# Patient Record
Sex: Female | Born: 1960 | Hispanic: No | Smoking: Former smoker
Health system: Southern US, Community
[De-identification: ages and names within clinical notes are randomized; demographics above are authoritative.]

## PROBLEM LIST (undated history)

## (undated) DIAGNOSIS — N189 Chronic kidney disease, unspecified: Secondary | ICD-10-CM

## (undated) DIAGNOSIS — F329 Major depressive disorder, single episode, unspecified: Secondary | ICD-10-CM

## (undated) DIAGNOSIS — I10 Essential (primary) hypertension: Secondary | ICD-10-CM

## (undated) DIAGNOSIS — M1990 Unspecified osteoarthritis, unspecified site: Secondary | ICD-10-CM

## (undated) DIAGNOSIS — F419 Anxiety disorder, unspecified: Secondary | ICD-10-CM

## (undated) DIAGNOSIS — F32A Depression, unspecified: Secondary | ICD-10-CM

## (undated) DIAGNOSIS — E7800 Pure hypercholesterolemia, unspecified: Secondary | ICD-10-CM

## (undated) DIAGNOSIS — K219 Gastro-esophageal reflux disease without esophagitis: Secondary | ICD-10-CM

## (undated) HISTORY — PX: TONSILLECTOMY: SUR1361

## (undated) HISTORY — PX: OTHER SURGICAL HISTORY: SHX169

## (undated) HISTORY — PX: ABDOMINAL HYSTERECTOMY: SHX81

## (undated) HISTORY — PX: KNEE SURGERY: SHX244

## (undated) HISTORY — PX: BACK SURGERY: SHX140

## (undated) HISTORY — PX: NECK SURGERY: SHX720

## (undated) HISTORY — PX: EAR MASTOIDECTOMY W/ COCHLEAR IMPLANT W/ LANDMARK: SHX1483

---
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
## 1998-05-17 ENCOUNTER — Emergency Department (HOSPITAL_COMMUNITY): Admission: EM | Admit: 1998-05-17 | Discharge: 1998-05-17 | Payer: Self-pay | Admitting: Emergency Medicine

## 1998-08-05 ENCOUNTER — Emergency Department (HOSPITAL_COMMUNITY): Admission: EM | Admit: 1998-08-05 | Discharge: 1998-08-05 | Payer: Self-pay | Admitting: Emergency Medicine

## 1998-08-25 ENCOUNTER — Other Ambulatory Visit: Admission: RE | Admit: 1998-08-25 | Discharge: 1998-08-25 | Payer: Self-pay | Admitting: Obstetrics and Gynecology

## 1999-10-10 ENCOUNTER — Other Ambulatory Visit: Admission: RE | Admit: 1999-10-10 | Discharge: 1999-10-10 | Payer: Self-pay | Admitting: Obstetrics and Gynecology

## 2000-06-24 ENCOUNTER — Emergency Department (HOSPITAL_COMMUNITY): Admission: EM | Admit: 2000-06-24 | Discharge: 2000-06-24 | Payer: Self-pay | Admitting: Emergency Medicine

## 2000-06-24 ENCOUNTER — Encounter: Payer: Self-pay | Admitting: Emergency Medicine

## 2001-02-20 ENCOUNTER — Other Ambulatory Visit: Admission: RE | Admit: 2001-02-20 | Discharge: 2001-02-20 | Payer: Self-pay | Admitting: Obstetrics and Gynecology

## 2001-05-08 ENCOUNTER — Encounter: Payer: Self-pay | Admitting: Obstetrics and Gynecology

## 2001-05-08 ENCOUNTER — Ambulatory Visit (HOSPITAL_COMMUNITY): Admission: RE | Admit: 2001-05-08 | Discharge: 2001-05-08 | Payer: Self-pay | Admitting: Obstetrics and Gynecology

## 2001-12-29 ENCOUNTER — Ambulatory Visit: Admission: RE | Admit: 2001-12-29 | Discharge: 2001-12-29 | Payer: Self-pay | Admitting: Orthopedic Surgery

## 2002-01-08 ENCOUNTER — Encounter: Admission: RE | Admit: 2002-01-08 | Discharge: 2002-03-26 | Payer: Self-pay | Admitting: Orthopedic Surgery

## 2002-01-21 ENCOUNTER — Ambulatory Visit (HOSPITAL_COMMUNITY): Admission: RE | Admit: 2002-01-21 | Discharge: 2002-01-21 | Payer: Self-pay | Admitting: Orthopedic Surgery

## 2002-01-24 ENCOUNTER — Emergency Department (HOSPITAL_COMMUNITY): Admission: EM | Admit: 2002-01-24 | Discharge: 2002-01-24 | Payer: Self-pay | Admitting: Emergency Medicine

## 2002-02-19 ENCOUNTER — Other Ambulatory Visit: Admission: RE | Admit: 2002-02-19 | Discharge: 2002-02-19 | Payer: Self-pay | Admitting: Obstetrics and Gynecology

## 2002-06-18 ENCOUNTER — Encounter: Admission: RE | Admit: 2002-06-18 | Discharge: 2002-09-16 | Payer: Self-pay | Admitting: Anesthesiology

## 2002-11-16 ENCOUNTER — Ambulatory Visit (HOSPITAL_COMMUNITY): Admission: RE | Admit: 2002-11-16 | Discharge: 2002-11-16 | Payer: Self-pay | Admitting: Orthopedic Surgery

## 2002-11-16 ENCOUNTER — Encounter: Payer: Self-pay | Admitting: Orthopedic Surgery

## 2020-04-28 ENCOUNTER — Encounter (HOSPITAL_COMMUNITY): Payer: Self-pay

## 2020-04-28 ENCOUNTER — Emergency Department (HOSPITAL_COMMUNITY): Payer: Medicare HMO

## 2020-04-28 ENCOUNTER — Emergency Department (HOSPITAL_COMMUNITY)
Admission: EM | Admit: 2020-04-28 | Discharge: 2020-04-28 | Disposition: A | Discharge status: Home / Self Care | Payer: Medicare HMO | Attending: Emergency Medicine | Admitting: Emergency Medicine

## 2020-04-28 ENCOUNTER — Other Ambulatory Visit: Payer: Self-pay

## 2020-04-28 DIAGNOSIS — M1711 Unilateral primary osteoarthritis, right knee: Secondary | ICD-10-CM | POA: Diagnosis not present

## 2020-04-28 DIAGNOSIS — Z87891 Personal history of nicotine dependence: Secondary | ICD-10-CM | POA: Insufficient documentation

## 2020-04-28 DIAGNOSIS — M25561 Pain in right knee: Secondary | ICD-10-CM | POA: Diagnosis present

## 2020-04-28 HISTORY — DX: Depression, unspecified: F32.A

## 2020-04-28 HISTORY — DX: Anxiety disorder, unspecified: F41.9

## 2020-04-28 IMAGING — CR DG KNEE COMPLETE 4+V*R*
4 series · 4 of 4 positions shown · non-contrast
Comparison: None.

CLINICAL DATA: Pain

EXAM:
RIGHT KNEE - COMPLETE 4+ VIEW

[t knee obl right (1 of 2)]
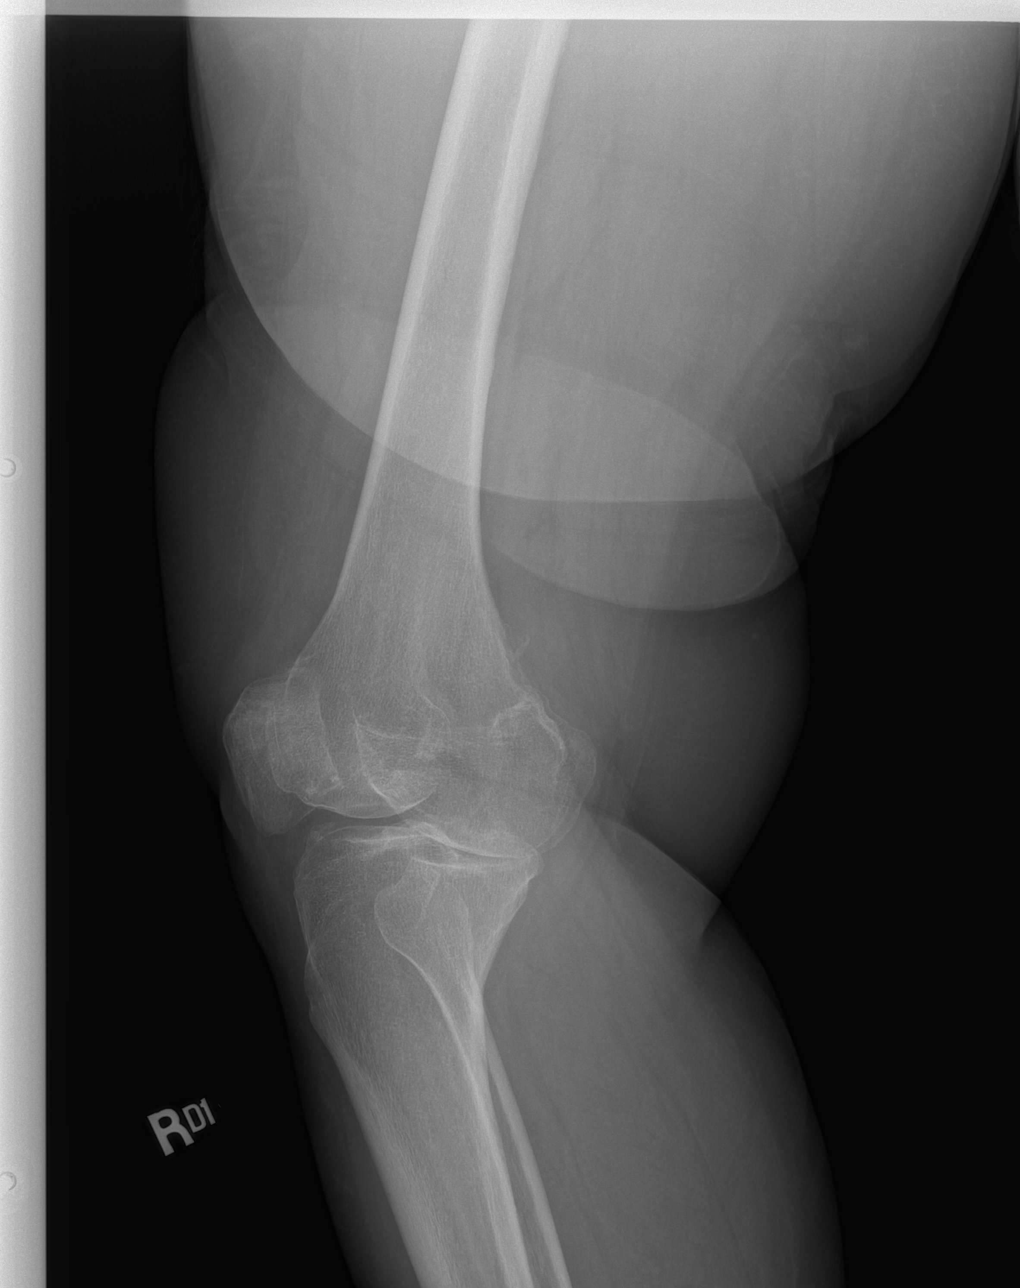

[t knee ap right]
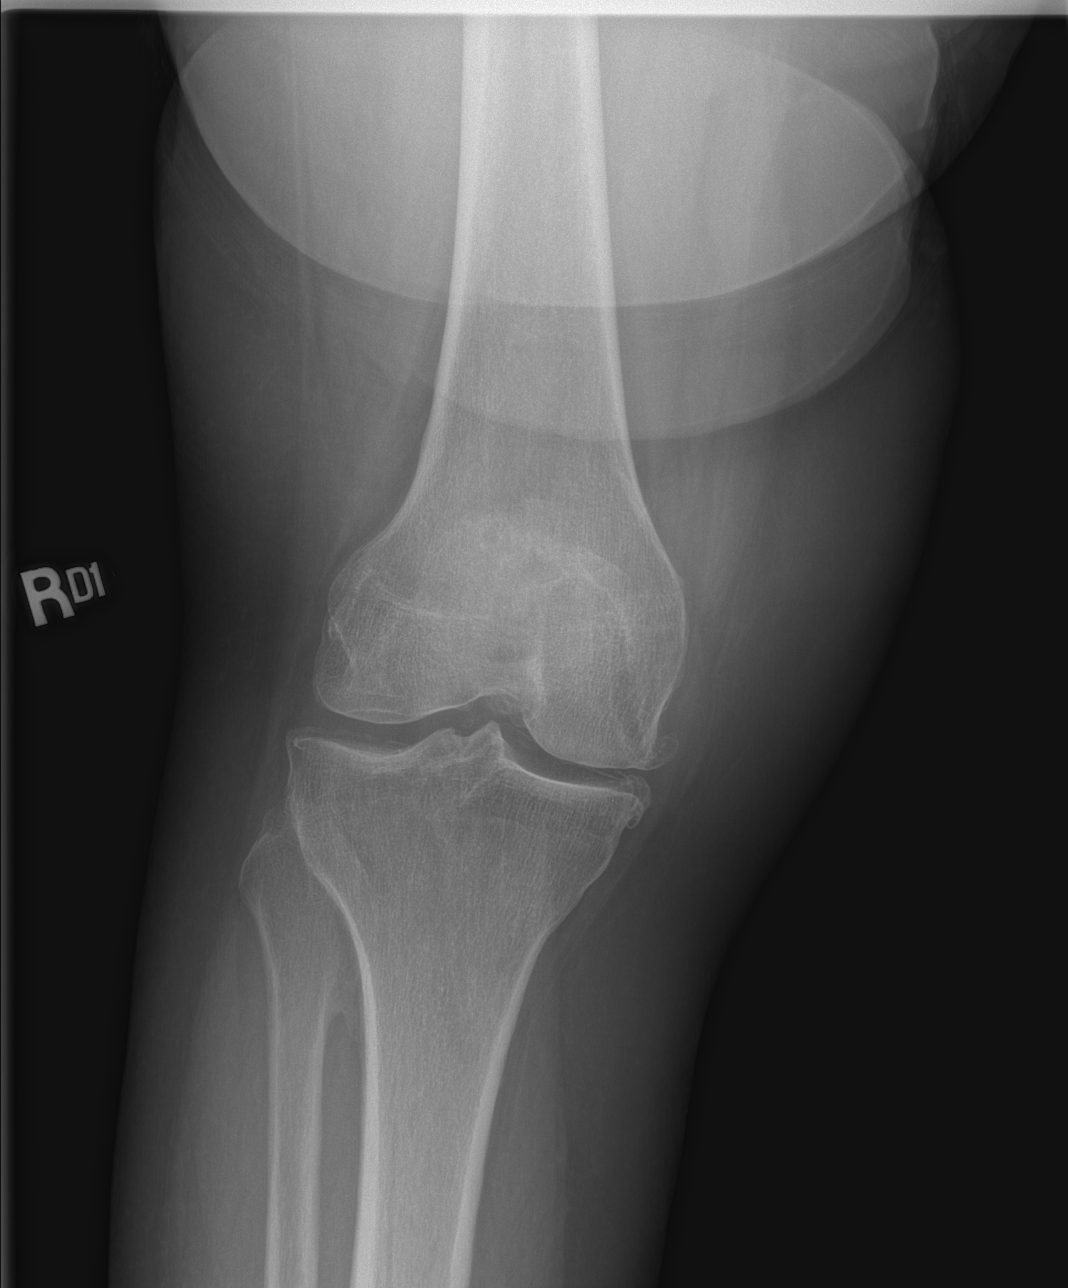

[t knee obl right (2 of 2)]
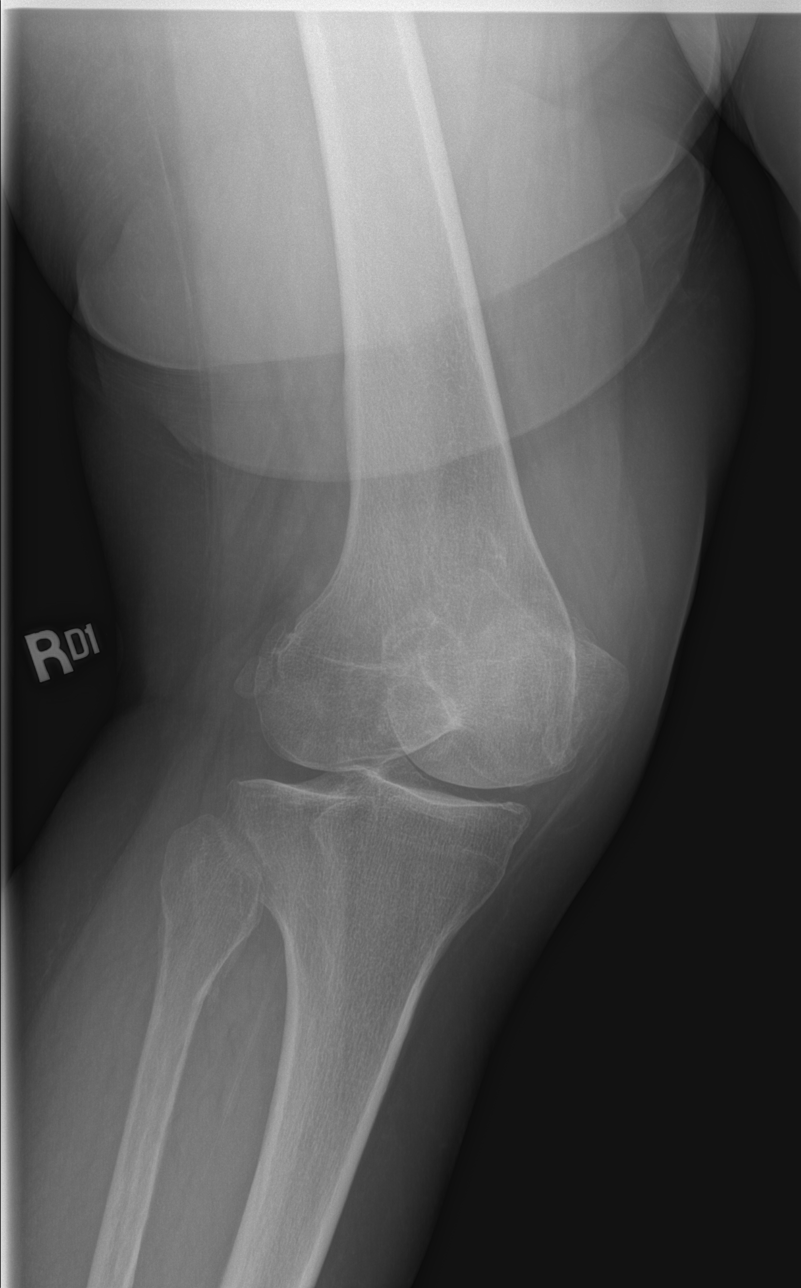

[t knee lat right]
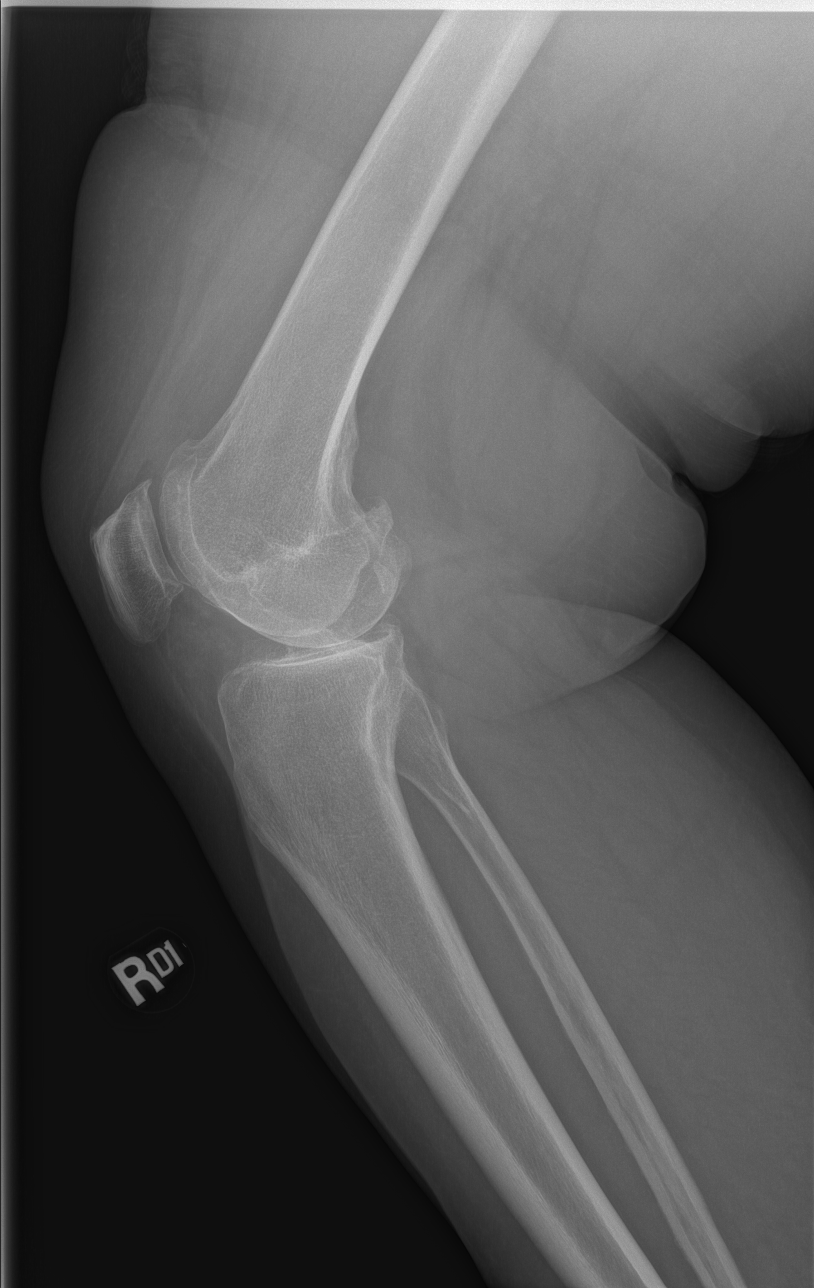

[4 of 4 positions shown; findings below may reference images not displayed]

FINDINGS: Frontal, lateral, and bilateral oblique views were obtained. There
is no fracture, dislocation, or joint effusion. There is moderately
severe narrowing of the patellofemoral joint. There is milder
narrowing medially. There is spurring medially and arising from the
posterior patella. No erosive change.
IMPRESSION: Osteoarthritic change, most notable in the patellofemoral joint. No
evident fracture, dislocation, or joint effusion.

## 2020-04-28 NOTE — ED Notes (Addendum)
Ortho called at this time. Per ortho: Brace will have to be ordered. Ortho will order brace.

## 2020-04-28 NOTE — Progress Notes (Signed)
Orthopedic Tech Progress Note Patient Details:  Brandy Delacruz 1961/10/16 [id number]  Patient ID: Brandy Delacruz, female   DOB: 05-15-1961, 59 y.o.   MRN: [id number]   Brandy Delacruz 04/28/2020, 12:28 PMCalled [facility] for hinged knee brace.

## 2020-04-28 NOTE — ED Triage Notes (Addendum)
Pt arrives [facility] from home with complaints of right knee pain that has been ongoing. Pt states she is supposed to have a knee replacement but has been unable to as of this time. Pt reports the knee "dislocates and pops back in" Pt reports that she took a tramadol, klonopin, and a muscle relaxer this am. Pt is drowsy in triage

## 2020-04-28 NOTE — Discharge Instructions (Addendum)
Please follow-up with your orthopedist, Dr. Madelon Lips, as soon as possible for ongoing evaluation management.  You likely benefit from steroid injection.  You also need to plan for definitive intervention via total knee replacement.  Please continue take your pain medications, as prescribed.  Please return to the ED or seek immediate medical attention should you experience any new or worsening symptoms.

## 2020-04-28 NOTE — ED Provider Notes (Signed)
[facility] Note   CSN: [id number] Arrival date & time: 04/28/20  1025     History Chief Complaint  Patient presents with  . Knee Pain    Brandy Delacruz is a 59 y.o. female with PMH of anxiety and depression who presents the ED via EMS for ongoing right knee discomfort.  On exam, patient is drowsy and endorses tramadol, Klonopin, and muscle relaxant use this morning PTA.  Patient states that she is followed by Dr. Madelon Lips with [facility].  Her knee discomfort stems from a meniscal injury in 2003 that resulted in numerous orthopedic surgeries.  She states that she was informed back in December 2020 that she would require a total knee replacement, however is also in the process of obtaining bladder surgery for her stress incontinence.  She recently was able to lose 40 pounds, per request of her orthopedist, in order to be a more viable surgical candidate.  She reports that she has still been able to ambulate despite her significant osteoarthritis, however this morning her knee was "locked" and she was having difficulty with any flexion and extension.  She denies any precipitating injury, redness or swelling, weakness or numbness, inability to wiggle toes, inability to flex or extend the joint, or other symptoms.  HPI     Past Medical History:  Diagnosis Date  . Anxiety   . Depression     There are no problems to display for this patient.   Past Surgical History:  Procedure Laterality Date  . BACK SURGERY       OB History   No obstetric history on file.     History reviewed. No pertinent family history.  Social History   Tobacco Use  . Smoking status: Former Games developer  . Smokeless tobacco: Never Used  Substance Use Topics  . Alcohol use: Yes    Comment: occ  . Drug use: Never    Home Medications Prior to Admission medications   Not on File    Allergies    Patient has no known  allergies.  Review of Systems   Review of Systems  Constitutional: Negative for fever.  Musculoskeletal: Positive for arthralgias and gait problem. Negative for joint swelling.  Skin: Negative for color change and wound.  Neurological: Negative for weakness and numbness.    Physical Exam Updated Vital Signs BP 95/69   Pulse (!) 56   Temp 98 F (36.7 C) (Oral)   Resp 16   SpO2 99%   Physical Exam Vitals and nursing note reviewed. Exam conducted with a chaperone present.  Constitutional:      Appearance: Normal appearance.  HENT:     Head: Normocephalic and atraumatic.  Eyes:     General: No scleral icterus.    Conjunctiva/sclera: Conjunctivae normal.  Cardiovascular:     Rate and Rhythm: Normal rate and regular rhythm.     Pulses: Normal pulses.     Heart sounds: Normal heart sounds.  Pulmonary:     Effort: Pulmonary effort is normal. No respiratory distress.     Breath sounds: Normal breath sounds. No wheezing or rales.  Musculoskeletal:     Cervical back: Normal range of motion. No rigidity.     Comments: Right knee: No significant swelling.  No erythema, ecchymosis, or other overlying skin changes.  ROM limited due to swelling and discomfort, however can still actively flex and extend.  Pedal pulse and cap refill intact.  Soft compartments.  Sensation intact  throughout.  Skin:    General: Skin is dry.     Capillary Refill: Capillary refill takes less than 2 seconds.  Neurological:     Mental Status: She is alert and oriented to person, place, and time.     GCS: GCS eye subscore is 4. GCS verbal subscore is 5. GCS motor subscore is 6.  Psychiatric:        Mood and Affect: Mood normal.        Behavior: Behavior normal.        Thought Content: Thought content normal.      ED Results / Procedures / Treatments   Labs (all labs ordered are listed, but only abnormal results are displayed) Labs Reviewed - No data to display  EKG None  Radiology DG Knee Complete  4 Views Right  Result Date: 04/28/2020 CLINICAL DATA:  Pain EXAM: RIGHT KNEE - COMPLETE 4+ VIEW COMPARISON:  None. FINDINGS: Frontal, lateral, and bilateral oblique views were obtained. There is no fracture, dislocation, or joint effusion. There is moderately severe narrowing of the patellofemoral joint. There is milder narrowing medially. There is spurring medially and arising from the posterior patella. No erosive change. IMPRESSION: Osteoarthritic change, most notable in the patellofemoral joint. No evident fracture, dislocation, or joint effusion. Electronically Signed   By: Bretta Bang III M.D.   On: 04/28/2020 11:29    Procedures Procedures (including critical care time)  Medications Ordered in ED Medications - No data to display  ED Course  I have reviewed the triage vital signs and the nursing notes.  Pertinent labs & imaging results that were available during my care of the patient were reviewed by me and considered in my medical decision making (see chart for details).    MDM Rules/Calculators/A&P                      Patient has well-documented osteoarthritis, particularly involving right knee.  She is followed by Dr. Madelon Lips [facility] and had discussed total knee replacement nearly 6 months ago.  She states that her symptoms have been getting progressively worse, however today was the first time that she needed help to get out of bed due to her significant leg stiffness and "grinding" pain.  She also endorses intermittent joint laxity.  Given this report, will provide hinged knee brace.  However, emphasized the importance of following up with her orthopedist for ongoing evaluation and management.  She had already taken Lyrica, Klonopin, tramadol, and a muscle relaxant PTA.  Discussed why further medication at this point could put her at increased risk of respiratory depression.  Instead, recommending that she go to her orthopedist the soonest possible she  would likely benefit from steroid injection.  She could also discuss plans for surgical intervention for definitive management.  She has done well to lose weight, per recommendation of her orthopedist, to prepare her for surgery.    Patient voices understanding is agreeable to plan.  Strict ED return precautions discussed.  All of the evaluation and work-up results were discussed with the patient and any family at bedside. They were provided opportunity to ask any additional questions and have none at this time. They have expressed understanding of verbal discharge instructions as well as return precautions and are agreeable to the plan.    Final Clinical Impression(s) / ED Diagnoses Final diagnoses:  Primary osteoarthritis of right knee    Rx / DC Orders ED Discharge Orders  None       Reita Chard 04/28/20 1204    Daleen Bo, MD 04/28/20 [id number]

## 2020-11-14 ENCOUNTER — Other Ambulatory Visit: Payer: Self-pay

## 2020-11-14 ENCOUNTER — Emergency Department (HOSPITAL_COMMUNITY)
Admission: EM | Admit: 2020-11-14 | Discharge: 2020-11-14 | Disposition: A | Discharge status: Home / Self Care | Payer: Medicare HMO | Attending: Emergency Medicine | Admitting: Emergency Medicine

## 2020-11-14 ENCOUNTER — Emergency Department (HOSPITAL_COMMUNITY): Payer: Medicare HMO

## 2020-11-14 ENCOUNTER — Encounter (HOSPITAL_COMMUNITY): Payer: Self-pay

## 2020-11-14 DIAGNOSIS — Z87891 Personal history of nicotine dependence: Secondary | ICD-10-CM | POA: Diagnosis not present

## 2020-11-14 DIAGNOSIS — K2900 Acute gastritis without bleeding: Secondary | ICD-10-CM | POA: Diagnosis not present

## 2020-11-14 DIAGNOSIS — R109 Unspecified abdominal pain: Secondary | ICD-10-CM | POA: Diagnosis present

## 2020-11-14 DIAGNOSIS — K219 Gastro-esophageal reflux disease without esophagitis: Secondary | ICD-10-CM | POA: Insufficient documentation

## 2020-11-14 HISTORY — DX: Gastro-esophageal reflux disease without esophagitis: K21.9

## 2020-11-14 LAB — TROPONIN I (HIGH SENSITIVITY)
Troponin I (High Sensitivity): 10 ng/L (ref <18)
Troponin I (High Sensitivity): 8 ng/L (ref <18)

## 2020-11-14 LAB — COMPREHENSIVE METABOLIC PANEL
ALT: 27 U/L (ref 0–44)
AST: 31 U/L (ref 15–41)
Albumin: 4.5 g/dL (ref 3.5–5.0)
Alkaline Phosphatase: 63 U/L (ref 38–126)
Anion gap: 8 (ref 5–15)
BUN: 24 mg/dL — ABNORMAL HIGH (ref 6–20)
CO2: 29 mmol/L (ref 22–32)
Calcium: 9.5 mg/dL (ref 8.9–10.3)
Chloride: 105 mmol/L (ref 98–111)
Creatinine, Ser: 1.11 mg/dL — ABNORMAL HIGH (ref 0.44–1.00)
GFR, Estimated: 58 mL/min — ABNORMAL LOW (ref >60)
Glucose, Bld: 107 mg/dL — ABNORMAL HIGH (ref 70–99)
Potassium: 4.3 mmol/L (ref 3.5–5.1)
Sodium: 142 mmol/L (ref 135–145)
Total Bilirubin: 0.6 mg/dL (ref 0.3–1.2)
Total Protein: 7.7 g/dL (ref 6.5–8.1)

## 2020-11-14 LAB — URINALYSIS, ROUTINE W REFLEX MICROSCOPIC
Bilirubin Urine: NEGATIVE
Glucose, UA: NEGATIVE mg/dL
Hgb urine dipstick: NEGATIVE
Ketones, ur: NEGATIVE mg/dL
Leukocytes,Ua: NEGATIVE
Nitrite: NEGATIVE
Protein, ur: NEGATIVE mg/dL
Specific Gravity, Urine: 1.004 — ABNORMAL LOW (ref 1.005–1.030)
pH: 8 (ref 5.0–8.0)

## 2020-11-14 LAB — CBC
HCT: 39.4 % (ref 36.0–46.0)
Hemoglobin: 12.7 g/dL (ref 12.0–15.0)
MCH: 31.1 pg (ref 26.0–34.0)
MCHC: 32.2 g/dL (ref 30.0–36.0)
MCV: 96.6 fL (ref 80.0–100.0)
Platelets: 198 10*3/uL (ref 150–400)
RBC: 4.08 MIL/uL (ref 3.87–5.11)
RDW: 13.3 % (ref 11.5–15.5)
WBC: 5.5 10*3/uL (ref 4.0–10.5)
nRBC: 0 % (ref 0.0–0.2)

## 2020-11-14 LAB — LIPASE, BLOOD: Lipase: 42 U/L (ref 11–51)

## 2020-11-14 IMAGING — DX DG CHEST 1V PORT
1 series · 1 of 1 positions shown · non-contrast
Comparison: None.

CLINICAL DATA: Epigastric pain

EXAM:
PORTABLE CHEST 1 VIEW

[chest ap]
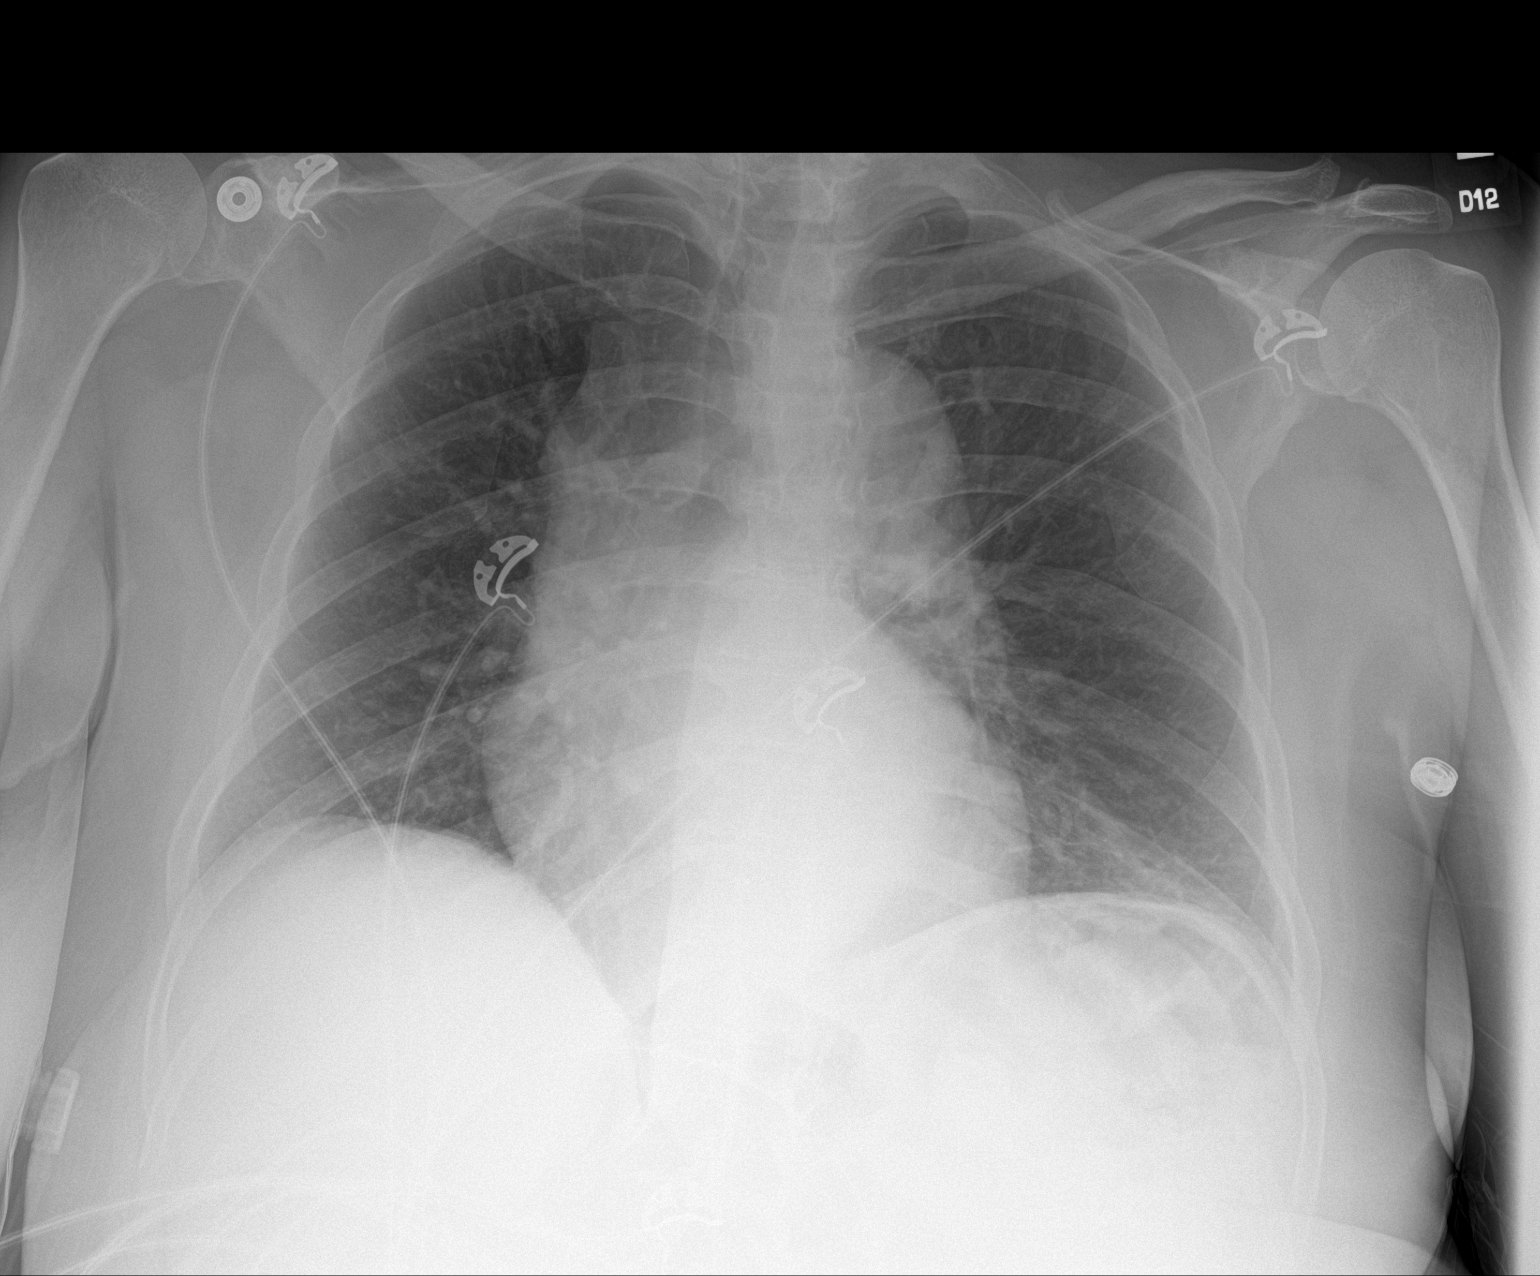

[1 of 1 positions shown; findings below may reference images not displayed]

FINDINGS: Surgical hardware in the cervical spine. Patchy atelectasis left
base. Normal heart size. Borderline enlarged mediastinal silhouette
likely due to rotation and portable technique. No pneumothorax
IMPRESSION: Patchy opacity left base, favor atelectasis.

## 2020-11-14 MED ORDER — SODIUM CHLORIDE 0.9 % IV BOLUS
1000.0000 mL | Freq: Once | INTRAVENOUS | Status: AC
  Administered 2020-11-14: 1000 mL via INTRAVENOUS

## 2020-11-14 MED ORDER — HALOPERIDOL LACTATE 5 MG/ML IJ SOLN
2.0000 mg | Freq: Once | INTRAMUSCULAR | Status: AC
  Administered 2020-11-14: 2 mg via INTRAVENOUS
  Filled 2020-11-14: qty 1

## 2020-11-14 MED ORDER — FAMOTIDINE IN NACL 20-0.9 MG/50ML-% IV SOLN
20.0000 mg | Freq: Once | INTRAVENOUS | Status: AC
  Administered 2020-11-14: 20 mg via INTRAVENOUS
  Filled 2020-11-14: qty 50

## 2020-11-14 MED ORDER — ONDANSETRON HCL 4 MG/2ML IJ SOLN
4.0000 mg | Freq: Once | INTRAMUSCULAR | Status: AC
  Administered 2020-11-14: 4 mg via INTRAVENOUS
  Filled 2020-11-14: qty 2

## 2020-11-14 MED ORDER — SUCRALFATE 1 G PO TABS
1.0000 g | ORAL_TABLET | Freq: Four times a day (QID) | ORAL | 0 refills | Status: DC | PRN
Start: 2020-11-14 — End: 2021-09-01

## 2020-11-14 NOTE — ED Provider Notes (Signed)
[facility]-EMERGENCY DEPT [facility] Emergency Department Provider Note MRN:  [id number]  Arrival date & time: 11/14/20     Chief Complaint   Abdominal Pain and Emesis   History of Present Illness   Brandy Delacruz is a 59 y.o. year-old female with a history of GERD presenting to the ED with chief complaint of abdominal pain and emesis.  Patient work-up with some very mild discomfort in the abdomen described as a sensation of feeling hungry.  When she proceeded to try to have a light breakfast she began experiencing severe nausea and profuse vomiting.  She is endorsing burning epigastric pain with radiation up into the chest.  Pain is currently 7 out of 10, constant, no exacerbating relieving factors.  Endorsing normal bowel movements recently, passing gas today.  Review of Systems  A complete 10 system review of systems was obtained and all systems are negative except as noted in the HPI and PMH.   Patient's Health History    Past Medical History:  Diagnosis Date  . Anxiety   . Depression   . GERD (gastroesophageal reflux disease)     Past Surgical History:  Procedure Laterality Date  . ABDOMINAL HYSTERECTOMY    . BACK SURGERY    . bladder stimulator    . KNEE SURGERY Right   . NECK SURGERY      Family History  Problem Relation Age of Onset  . Diabetes Mother   . Stroke Mother     Social History   Socioeconomic History  . Marital status: Legally Separated    Spouse name: Not on file  . Number of children: Not on file  . Years of education: Not on file  . Highest education level: Not on file  Occupational History  . Not on file  Tobacco Use  . Smoking status: Former Games developer  . Smokeless tobacco: Never Used  Vaping Use  . Vaping Use: Never used  Substance and Sexual Activity  . Alcohol use: Yes    Comment: occ  . Drug use: Never  . Sexual activity: Not on file  Other Topics Concern  . Not on file  Social History Narrative  . Not on file   Social  Determinants of Health   Financial Resource Strain:   . Difficulty of Paying Living Expenses: Not on file  Food Insecurity:   . Worried About Programme researcher, broadcasting/film/video in the Last Year: Not on file  . Ran Out of Food in the Last Year: Not on file  Transportation Needs:   . Lack of Transportation (Medical): Not on file  . Lack of Transportation (Non-Medical): Not on file  Physical Activity:   . Days of Exercise per Week: Not on file  . Minutes of Exercise per Session: Not on file  Stress:   . Feeling of Stress : Not on file  Social Connections:   . Frequency of Communication with Friends and Family: Not on file  . Frequency of Social Gatherings with Friends and Family: Not on file  . Attends Religious Services: Not on file  . Active Member of Clubs or Organizations: Not on file  . Attends Banker Meetings: Not on file  . Marital Status: Not on file  Intimate Partner Violence:   . Fear of Current or Ex-Partner: Not on file  . Emotionally Abused: Not on file  . Physically Abused: Not on file  . Sexually Abused: Not on file     Physical Exam   Vitals:  11/14/20 1830 11/14/20 1930  BP: (!) 142/85 129/85  Pulse: (!) 56 64  Resp: 17 13  Temp:    SpO2: 98% 97%    CONSTITUTIONAL: Well-appearing, NAD NEURO:  Alert and oriented x 3, no focal deficits EYES:  eyes equal and reactive ENT/NECK:  no LAD, no JVD CARDIO: Regular rate, well-perfused, normal S1 and S2 PULM:  CTAB no wheezing or rhonchi GI/GU:  normal bowel sounds, non-distended, non-tender MSK/SPINE:  No gross deformities, no edema SKIN:  no rash, atraumatic PSYCH:  Appropriate speech and behavior  *Additional and/or pertinent findings included in MDM below  Diagnostic and Interventional Summary    EKG Interpretation  Date/Time:  November 14, 2020 at 16: 38: 39 Ventricular Rate:  69 PR Interval:    QRS Duration: 99   QT Interval:  461 QTC Calculation: 494 R Axis:     Text Interpretation: Sinus  rhythm, normal intervals Confirm by Dr. Kennis Carina at 4:49 PM      Labs Reviewed  COMPREHENSIVE METABOLIC PANEL - Abnormal; Notable for the following components:      Result Value   Glucose, Bld 107 (*)    BUN 24 (*)    Creatinine, Ser 1.11 (*)    GFR, Estimated 58 (*)    All other components within normal limits  URINALYSIS, ROUTINE W REFLEX MICROSCOPIC - Abnormal; Notable for the following components:   Color, Urine COLORLESS (*)    Specific Gravity, Urine 1.004 (*)    All other components within normal limits  LIPASE, BLOOD  CBC  TROPONIN I (HIGH SENSITIVITY)  TROPONIN I (HIGH SENSITIVITY)    DG Chest Port 1 View  Final Result      Medications  sodium chloride 0.9 % bolus 1,000 mL (0 mLs Intravenous Stopped 11/14/20 1730)  ondansetron (ZOFRAN) injection 4 mg (4 mg Intravenous Given 11/14/20 1648)  famotidine (PEPCID) IVPB 20 mg premix (0 mg Intravenous Stopped 11/14/20 1730)  haloperidol lactate (HALDOL) injection 2 mg (2 mg Intravenous Given 11/14/20 1924)     Procedures  /  Critical Care Procedures  ED Course and Medical Decision Making  I have reviewed the triage vital signs, the nursing notes, and pertinent available records from the EMR.  Listed above are laboratory and imaging tests that I personally ordered, reviewed, and interpreted and then considered in my medical decision making (see below for details).  Burning epigastric pain with radiation into the chest, favoring GERD or gastritis.  Recent implantation of bladder stimulator 11 days ago, she denies having any issues with this procedure.  Denies fever.  Abdomen soft and nontender, providing pain control, fluids, Zofran, awaiting labs, chest x-ray.     Patient feeling much better after interventions listed above.  Work-up is very reassuring, troponin negative x2, normal labs.  Patient likely has gastritis.  She has not been taking her omeprazole daily.  Appropriate for discharge.  Elmer Sow. Pilar Plate,  MD [facility] Emergency Medicine [facility] [email or web address]  Final Clinical Impressions(s) / ED Diagnoses     ICD-10-CM   1. Acute gastritis without hemorrhage, unspecified gastritis type  K29.00     ED Discharge Orders         Ordered    sucralfate (CARAFATE) 1 g tablet  4 times daily PRN        11/14/20 2030           Discharge Instructions Discussed with and Provided to Patient:     Discharge Instructions  You were evaluated in the Emergency Department and after careful evaluation, we did not find any emergent condition requiring admission or further testing in the hospital.  Your exam/testing today was overall reassuring.  Symptoms seem to be due to gastritis.  Please continue taking your omeprazole at home.  You can use the Carafate medication during the day for discomfort as needed.  Please return to the Emergency Department if you experience any worsening of your condition.  Thank you for allowing [place] to be a part of your care.        Sabas Sous, MD 11/14/20 2031

## 2020-11-14 NOTE — ED Triage Notes (Signed)
Per EMS- patient c/o epigastric pain, N/V since waking at 0900 today. Patient took Zofran and Pepto Bismol but vomited.  EMS gave Zofran 4 mg Iv prior to arrival to the ED.

## 2020-11-14 NOTE — Discharge Instructions (Addendum)
You were evaluated in the Emergency Department and after careful evaluation, we did not find any emergent condition requiring admission or further testing in the hospital.  Your exam/testing today was overall reassuring.  Symptoms seem to be due to gastritis.  Please continue taking your omeprazole at home.  You can use the Carafate medication during the day for discomfort as needed.  Please return to the Emergency Department if you experience any worsening of your condition.  Thank you for allowing [place] to be a part of your care.

## 2020-11-14 NOTE — ED Notes (Signed)
Assumed care for pt at this time. Brought to room from waiting room.

## 2021-02-10 DIAGNOSIS — M47816 Spondylosis without myelopathy or radiculopathy, lumbar region: Secondary | ICD-10-CM | POA: Diagnosis not present

## 2021-02-10 DIAGNOSIS — M4712 Other spondylosis with myelopathy, cervical region: Secondary | ICD-10-CM | POA: Diagnosis not present

## 2021-02-10 DIAGNOSIS — G894 Chronic pain syndrome: Secondary | ICD-10-CM | POA: Diagnosis not present

## 2021-02-10 DIAGNOSIS — M150 Primary generalized (osteo)arthritis: Secondary | ICD-10-CM | POA: Diagnosis not present

## 2021-03-09 DIAGNOSIS — E7800 Pure hypercholesterolemia, unspecified: Secondary | ICD-10-CM | POA: Diagnosis not present

## 2021-03-09 DIAGNOSIS — K219 Gastro-esophageal reflux disease without esophagitis: Secondary | ICD-10-CM | POA: Diagnosis not present

## 2021-03-09 DIAGNOSIS — R69 Illness, unspecified: Secondary | ICD-10-CM | POA: Diagnosis not present

## 2021-03-09 DIAGNOSIS — F419 Anxiety disorder, unspecified: Secondary | ICD-10-CM | POA: Diagnosis not present

## 2021-03-09 DIAGNOSIS — N1831 Chronic kidney disease, stage 3a: Secondary | ICD-10-CM | POA: Diagnosis not present

## 2021-03-09 DIAGNOSIS — G894 Chronic pain syndrome: Secondary | ICD-10-CM | POA: Diagnosis not present

## 2021-03-09 DIAGNOSIS — F5101 Primary insomnia: Secondary | ICD-10-CM | POA: Diagnosis not present

## 2021-03-09 DIAGNOSIS — E559 Vitamin D deficiency, unspecified: Secondary | ICD-10-CM | POA: Diagnosis not present

## 2021-04-26 DIAGNOSIS — M4712 Other spondylosis with myelopathy, cervical region: Secondary | ICD-10-CM | POA: Diagnosis not present

## 2021-04-26 DIAGNOSIS — G894 Chronic pain syndrome: Secondary | ICD-10-CM | POA: Diagnosis not present

## 2021-04-26 DIAGNOSIS — M150 Primary generalized (osteo)arthritis: Secondary | ICD-10-CM | POA: Diagnosis not present

## 2021-04-26 DIAGNOSIS — M47816 Spondylosis without myelopathy or radiculopathy, lumbar region: Secondary | ICD-10-CM | POA: Diagnosis not present

## 2021-04-26 DIAGNOSIS — Z79891 Long term (current) use of opiate analgesic: Secondary | ICD-10-CM | POA: Diagnosis not present

## 2021-05-17 DIAGNOSIS — M4712 Other spondylosis with myelopathy, cervical region: Secondary | ICD-10-CM | POA: Diagnosis not present

## 2021-05-17 DIAGNOSIS — M47816 Spondylosis without myelopathy or radiculopathy, lumbar region: Secondary | ICD-10-CM | POA: Diagnosis not present

## 2021-05-17 DIAGNOSIS — G894 Chronic pain syndrome: Secondary | ICD-10-CM | POA: Diagnosis not present

## 2021-05-17 DIAGNOSIS — M150 Primary generalized (osteo)arthritis: Secondary | ICD-10-CM | POA: Diagnosis not present

## 2021-05-19 DIAGNOSIS — K4090 Unilateral inguinal hernia, without obstruction or gangrene, not specified as recurrent: Secondary | ICD-10-CM | POA: Diagnosis not present

## 2021-05-31 DIAGNOSIS — M4712 Other spondylosis with myelopathy, cervical region: Secondary | ICD-10-CM | POA: Diagnosis not present

## 2021-05-31 DIAGNOSIS — G894 Chronic pain syndrome: Secondary | ICD-10-CM | POA: Diagnosis not present

## 2021-05-31 DIAGNOSIS — M150 Primary generalized (osteo)arthritis: Secondary | ICD-10-CM | POA: Diagnosis not present

## 2021-05-31 DIAGNOSIS — M47816 Spondylosis without myelopathy or radiculopathy, lumbar region: Secondary | ICD-10-CM | POA: Diagnosis not present

## 2021-06-06 DIAGNOSIS — K449 Diaphragmatic hernia without obstruction or gangrene: Secondary | ICD-10-CM | POA: Diagnosis not present

## 2021-06-06 DIAGNOSIS — R140 Abdominal distension (gaseous): Secondary | ICD-10-CM | POA: Diagnosis not present

## 2021-06-06 DIAGNOSIS — K2289 Other specified disease of esophagus: Secondary | ICD-10-CM | POA: Diagnosis not present

## 2021-06-06 DIAGNOSIS — R1011 Right upper quadrant pain: Secondary | ICD-10-CM | POA: Diagnosis not present

## 2021-06-06 DIAGNOSIS — M7989 Other specified soft tissue disorders: Secondary | ICD-10-CM | POA: Diagnosis not present

## 2021-06-06 DIAGNOSIS — K828 Other specified diseases of gallbladder: Secondary | ICD-10-CM | POA: Diagnosis not present

## 2021-06-06 DIAGNOSIS — R2241 Localized swelling, mass and lump, right lower limb: Secondary | ICD-10-CM | POA: Diagnosis not present

## 2021-06-06 DIAGNOSIS — Z79899 Other long term (current) drug therapy: Secondary | ICD-10-CM | POA: Diagnosis not present

## 2021-06-06 DIAGNOSIS — R635 Abnormal weight gain: Secondary | ICD-10-CM | POA: Diagnosis not present

## 2021-06-06 DIAGNOSIS — R7989 Other specified abnormal findings of blood chemistry: Secondary | ICD-10-CM | POA: Diagnosis not present

## 2021-06-06 DIAGNOSIS — K429 Umbilical hernia without obstruction or gangrene: Secondary | ICD-10-CM | POA: Diagnosis not present

## 2021-06-06 DIAGNOSIS — K219 Gastro-esophageal reflux disease without esophagitis: Secondary | ICD-10-CM | POA: Diagnosis not present

## 2021-06-06 DIAGNOSIS — M1909 Primary osteoarthritis, other specified site: Secondary | ICD-10-CM | POA: Diagnosis not present

## 2021-06-06 DIAGNOSIS — R600 Localized edema: Secondary | ICD-10-CM | POA: Diagnosis not present

## 2021-06-06 DIAGNOSIS — R1031 Right lower quadrant pain: Secondary | ICD-10-CM | POA: Diagnosis not present

## 2021-06-06 DIAGNOSIS — R0602 Shortness of breath: Secondary | ICD-10-CM | POA: Diagnosis not present

## 2021-06-06 DIAGNOSIS — E785 Hyperlipidemia, unspecified: Secondary | ICD-10-CM | POA: Diagnosis not present

## 2021-06-06 DIAGNOSIS — R69 Illness, unspecified: Secondary | ICD-10-CM | POA: Diagnosis not present

## 2021-06-07 DIAGNOSIS — R0602 Shortness of breath: Secondary | ICD-10-CM | POA: Diagnosis not present

## 2021-06-07 DIAGNOSIS — R140 Abdominal distension (gaseous): Secondary | ICD-10-CM | POA: Diagnosis not present

## 2021-06-07 DIAGNOSIS — K828 Other specified diseases of gallbladder: Secondary | ICD-10-CM | POA: Diagnosis not present

## 2021-06-07 DIAGNOSIS — K449 Diaphragmatic hernia without obstruction or gangrene: Secondary | ICD-10-CM | POA: Diagnosis not present

## 2021-06-07 DIAGNOSIS — K2289 Other specified disease of esophagus: Secondary | ICD-10-CM | POA: Diagnosis not present

## 2021-06-07 DIAGNOSIS — R7989 Other specified abnormal findings of blood chemistry: Secondary | ICD-10-CM | POA: Diagnosis not present

## 2021-06-07 DIAGNOSIS — R2241 Localized swelling, mass and lump, right lower limb: Secondary | ICD-10-CM | POA: Diagnosis not present

## 2021-06-07 DIAGNOSIS — K429 Umbilical hernia without obstruction or gangrene: Secondary | ICD-10-CM | POA: Diagnosis not present

## 2021-06-09 DIAGNOSIS — E559 Vitamin D deficiency, unspecified: Secondary | ICD-10-CM | POA: Diagnosis not present

## 2021-06-09 DIAGNOSIS — F419 Anxiety disorder, unspecified: Secondary | ICD-10-CM | POA: Diagnosis not present

## 2021-06-09 DIAGNOSIS — F5101 Primary insomnia: Secondary | ICD-10-CM | POA: Diagnosis not present

## 2021-06-09 DIAGNOSIS — K219 Gastro-esophageal reflux disease without esophagitis: Secondary | ICD-10-CM | POA: Diagnosis not present

## 2021-06-09 DIAGNOSIS — N1831 Chronic kidney disease, stage 3a: Secondary | ICD-10-CM | POA: Diagnosis not present

## 2021-06-09 DIAGNOSIS — R69 Illness, unspecified: Secondary | ICD-10-CM | POA: Diagnosis not present

## 2021-06-09 DIAGNOSIS — E7800 Pure hypercholesterolemia, unspecified: Secondary | ICD-10-CM | POA: Diagnosis not present

## 2021-06-09 DIAGNOSIS — G894 Chronic pain syndrome: Secondary | ICD-10-CM | POA: Diagnosis not present

## 2021-06-09 DIAGNOSIS — R609 Edema, unspecified: Secondary | ICD-10-CM | POA: Diagnosis not present

## 2021-06-30 DIAGNOSIS — M150 Primary generalized (osteo)arthritis: Secondary | ICD-10-CM | POA: Diagnosis not present

## 2021-06-30 DIAGNOSIS — M4712 Other spondylosis with myelopathy, cervical region: Secondary | ICD-10-CM | POA: Diagnosis not present

## 2021-06-30 DIAGNOSIS — M47816 Spondylosis without myelopathy or radiculopathy, lumbar region: Secondary | ICD-10-CM | POA: Diagnosis not present

## 2021-06-30 DIAGNOSIS — G894 Chronic pain syndrome: Secondary | ICD-10-CM | POA: Diagnosis not present

## 2021-07-06 ENCOUNTER — Ambulatory Visit: Payer: Self-pay | Admitting: Surgery

## 2021-07-06 DIAGNOSIS — K4090 Unilateral inguinal hernia, without obstruction or gangrene, not specified as recurrent: Secondary | ICD-10-CM | POA: Diagnosis not present

## 2021-07-06 NOTE — H&P (Signed)
Surgical Evaluation Requesting provider: Horton Marshall, PA [facility])  Chief Complaint: Hernia  HPI: 60 year old woman referred for evaluation of left groin pain and reducible bulge consistent with hernia.  The bulge gets larger throughout the day and is very tender especially when she tries to reduce it.  She does have chronic pain and takes morphine along with several other medications..  Additional history includes anxiety and depression, hyperlipidemia, insomnia, vitamin D deficiency, chronic pain, arthritis, stress and urge incontinence, GERD, and CKD 3.   She states that she has actually had pain in the left groin for several years, and that it has been attributed to scar tissue from her previous laparoscopies for endometriosis/her C-section.  A couple months ago however she noticed a bulge in this area.  This reduces when she is supine, and she is able to manually reduce it as well.  This is exquisitely tender.  She has had some intermittent nausea, but denies other GI symptoms.  No Known Allergies  Past Medical History:  Diagnosis Date   Anxiety    Depression    GERD (gastroesophageal reflux disease)     Past Surgical History:  Procedure Laterality Date   ABDOMINAL HYSTERECTOMY     BACK SURGERY     bladder stimulator     KNEE SURGERY Right    NECK SURGERY      Family History  Problem Relation Age of Onset   Diabetes Mother    Stroke Mother     Social History   Socioeconomic History   Marital status: Legally Separated    Spouse name: Not on file   Number of children: Not on file   Years of education: Not on file   Highest education level: Not on file  Occupational History   Not on file  Tobacco Use   Smoking status: Former   Smokeless tobacco: Never  Vaping Use   Vaping Use: Never used  Substance and Sexual Activity   Alcohol use: Yes    Comment: occ   Drug use: Never   Sexual activity: Not on file  Other Topics Concern   Not on file  Social History  Narrative   Not on file   Social Determinants of Health   Financial Resource Strain: Not on file  Food Insecurity: Not on file  Transportation Needs: Not on file  Physical Activity: Not on file  Stress: Not on file  Social Connections: Not on file    Current Outpatient Medications on File Prior to Visit  Medication Sig Dispense Refill   sucralfate (CARAFATE) 1 g tablet Take 1 tablet (1 g total) by mouth 4 (four) times daily as needed. 30 tablet 0   No current facility-administered medications on file prior to visit.    Review of Systems: a complete, 10pt review of systems was completed with pertinent positives and negatives as documented in the HPI  Physical Exam: There were no vitals filed for this visit. Gen: A&Ox3, no distress  Eyes: lids and conjunctivae normal, no icterus. Pupils equally round and reactive to light.  Neck: supple without mass or thyromegaly Chest: respiratory effort is normal. No crepitus or tenderness on palpation of the chest. Breath sounds equal.  Cardiovascular: RRR with palpable distal pulses, no pedal edema Gastrointestinal: soft, nondistended, nontender. No mass, hepatomegaly or splenomegaly. Exquisitely tender but reducible left inguinal hernia. Lymphatic: no lymphadenopathy in the neck or groin Muscoloskeletal: no clubbing or cyanosis of the fingers.  Strength is symmetrical throughout.  Range of motion of  bilateral upper and lower extremities normal without pain, crepitation or contracture. Neuro: cranial nerves grossly intact.  Sensation intact to light touch diffusely. Psych: appropriate mood and affect, normal insight/judgment intact  Skin: warm and dry   CBC Latest Ref Rng & Units 11/14/2020  WBC 4.0 - 10.5 K/uL 5.5  Hemoglobin 12.0 - 15.0 g/dL 50.0  Hematocrit 93.8 - 46.0 % 39.4  Platelets 150 - 400 K/uL 198    CMP Latest Ref Rng & Units 11/14/2020  Glucose 70 - 99 mg/dL 182(X)  BUN 6 - 20 mg/dL 93(Z)  Creatinine 1.69 - 1.00 mg/dL  6.78(L)  Sodium 381 - 145 mmol/L 142  Potassium 3.5 - 5.1 mmol/L 4.3  Chloride 98 - 111 mmol/L 105  CO2 22 - 32 mmol/L 29  Calcium 8.9 - 10.3 mg/dL 9.5  Total Protein 6.5 - 8.1 g/dL 7.7  Total Bilirubin 0.3 - 1.2 mg/dL 0.6  Alkaline Phos 38 - 126 U/L 63  AST 15 - 41 U/L 31  ALT 0 - 44 U/L 27    No results found for: INR, PROTIME  Imaging: No results found.   A/P: Symptomatic left inguinal hernia.  I recommend repair.  We discussed the options of laparoscopic/robotic or open.  Given her habitus and chronic pain history, I recommend a minimally invasive approach.  We went over the relevant anatomy, surgical technique, and risks of bleeding, infection, pain, scarring, injury to intra-abdominal or retroperitoneal structures, chronic pain, hernia recurrence, as well as general cardiovascular/thromboembolic/pulmonary complications.  Questions welcomed and answered.  Schedule surgery at her convenience.    There are no problems to display for this patient.      Phylliss Blakes, MD [facility], [place]  See AMION to contact appropriate on-call provider

## 2021-07-20 DIAGNOSIS — M25561 Pain in right knee: Secondary | ICD-10-CM | POA: Diagnosis not present

## 2021-07-27 DIAGNOSIS — Z96651 Presence of right artificial knee joint: Secondary | ICD-10-CM | POA: Diagnosis not present

## 2021-07-27 DIAGNOSIS — M25561 Pain in right knee: Secondary | ICD-10-CM | POA: Diagnosis not present

## 2021-08-11 DIAGNOSIS — Z79891 Long term (current) use of opiate analgesic: Secondary | ICD-10-CM | POA: Diagnosis not present

## 2021-08-11 DIAGNOSIS — M150 Primary generalized (osteo)arthritis: Secondary | ICD-10-CM | POA: Diagnosis not present

## 2021-08-11 DIAGNOSIS — M4712 Other spondylosis with myelopathy, cervical region: Secondary | ICD-10-CM | POA: Diagnosis not present

## 2021-08-11 DIAGNOSIS — M47816 Spondylosis without myelopathy or radiculopathy, lumbar region: Secondary | ICD-10-CM | POA: Diagnosis not present

## 2021-08-11 DIAGNOSIS — G894 Chronic pain syndrome: Secondary | ICD-10-CM | POA: Diagnosis not present

## 2021-08-14 NOTE — Patient Instructions (Signed)
DUE TO COVID-19 ONLY ONE VISITOR IS ALLOWED TO COME WITH YOU AND STAY IN THE WAITING ROOM ONLY DURING PRE OP AND PROCEDURE.   **NO VISITORS ARE ALLOWED IN THE SHORT STAY AREA OR RECOVERY ROOM!!**  IF YOU WILL BE ADMITTED INTO THE HOSPITAL YOU ARE ALLOWED ONLY TWO SUPPORT PEOPLE DURING VISITATION HOURS ONLY (10AM -8PM)   The support person(s) may change daily. The support person(s) must pass our screening, gel in and out, and wear a mask at all times, including in the patient's room. Patients must also wear a mask when staff or their support person are in the room.  No visitors under the age of 67. Any visitor under the age of 51 must be accompanied by an adult.     Your procedure is scheduled on: 09/01/21   Report to [facility] Main  Entrance    Report to admitting at : 7:15 AM   Call this number if you have problems the morning of surgery [phone]   Do not eat food :After Midnight.   May have liquids until : 6:30 AM   day of surgery  CLEAR LIQUID DIET  Foods Allowed                                                                     Foods Excluded  Water, Black Coffee and tea, regular and decaf                             liquids that you cannot  Plain Jell-O in any flavor  (No red)                                           see through such as: Fruit ices (not with fruit pulp)                                     milk, soups, orange juice              Iced Popsicles (No red)                                    All solid food                                   Apple juices Sports drinks like Gatorade (No red) Lightly seasoned clear broth or consume(fat free) Sugar  Sample Menu Breakfast                                Lunch                                     Supper Cranberry juice  Beef broth                            Chicken broth Jell-O                                     Grape juice                           Apple juice Coffee or tea                         Jell-O                                      Popsicle                                                Coffee or tea                        Coffee or tea       Oral Hygiene is also important to reduce your risk of infection.                                    Remember - BRUSH YOUR TEETH THE MORNING OF SURGERY WITH YOUR REGULAR TOOTHPASTE   Do NOT smoke after Midnight   Take these medicines the morning of surgery with A SIP OF WATER: lyrica,escitalopram,cetirizine,famotidine,omeprazole.Clonazepam as needed.                              You may not have any metal on your body including hair pins, jewelry, and body piercing             Do not wear make-up, lotions, powders, perfumes/cologne, or deodorant  Do not wear nail polish including gel and S&S, artificial/acrylic nails, or any other type of covering on natural nails including finger and toenails. If you have artificial nails, gel coating, etc. that needs to be removed by a nail salon please have this removed prior to surgery or surgery may need to be canceled/ delayed if the surgeon/ anesthesia feels like they are unable to be safely monitored.   Do not shave  48 hours prior to surgery.    Do not bring valuables to the hospital. [facility] IS NOT             RESPONSIBLE   FOR VALUABLES.   Contacts, dentures or bridgework may not be worn into surgery.   Bring small overnight bag day of surgery.    Patients discharged the day of surgery will not be allowed to drive home.   Special Instructions: Bring a copy of your healthcare power of attorney and living will documents         the day of surgery if you haven't scanned them in before.              Please read over the following fact sheets  you were given: IF YOU HAVE QUESTIONS ABOUT YOUR PRE OP INSTRUCTIONS PLEASE CALL [phone]   [place] - Preparing for Surgery Before surgery, you can play an important role.  Because skin is not sterile, your skin needs to be as free  of germs as possible.  You can reduce the number of germs on your skin by washing with CHG (chlorahexidine gluconate) soap before surgery.  CHG is an antiseptic cleaner which kills germs and bonds with the skin to continue killing germs even after washing. Please DO NOT use if you have an allergy to CHG or antibacterial soaps.  If your skin becomes reddened/irritated stop using the CHG and inform your nurse when you arrive at Short Stay. Do not shave (including legs and underarms) for at least 48 hours prior to the first CHG shower.  You may shave your face/neck. Please follow these instructions carefully:  1.  Shower with CHG Soap the night before surgery and the  morning of Surgery.  2.  If you choose to wash your hair, wash your hair first as usual with your  normal  shampoo.  3.  After you shampoo, rinse your hair and body thoroughly to remove the  shampoo.                           4.  Use CHG as you would any other liquid soap.  You can apply chg directly  to the skin and wash                       Gently with a scrungie or clean washcloth.  5.  Apply the CHG Soap to your body ONLY FROM THE NECK DOWN.   Do not use on face/ open                           Wound or open sores. Avoid contact with eyes, ears mouth and genitals (private parts).                       Wash face,  Genitals (private parts) with your normal soap.             6.  Wash thoroughly, paying special attention to the area where your surgery  will be performed.  7.  Thoroughly rinse your body with warm water from the neck down.  8.  DO NOT shower/wash with your normal soap after using and rinsing off  the CHG Soap.                9.  Pat yourself dry with a clean towel.            10.  Wear clean pajamas.            11.  Place clean sheets on your bed the night of your first shower and do not  sleep with pets. Day of Surgery : Do not apply any lotions/deodorants the morning of surgery.  Please wear clean clothes to the  hospital/surgery center.  FAILURE TO FOLLOW THESE INSTRUCTIONS MAY RESULT IN THE CANCELLATION OF YOUR SURGERY PATIENT SIGNATURE_________________________________  NURSE SIGNATURE__________________________________  ________________________________________________________________________

## 2021-08-15 ENCOUNTER — Encounter (HOSPITAL_COMMUNITY)
Admission: RE | Admit: 2021-08-15 | Discharge: 2021-08-15 | Disposition: A | Discharge status: Home / Self Care | Payer: Medicare HMO | Source: Ambulatory Visit | Attending: Surgery | Admitting: Surgery

## 2021-08-15 ENCOUNTER — Encounter (HOSPITAL_COMMUNITY): Payer: Self-pay

## 2021-08-15 ENCOUNTER — Other Ambulatory Visit: Payer: Self-pay

## 2021-08-15 DIAGNOSIS — Z01812 Encounter for preprocedural laboratory examination: Secondary | ICD-10-CM | POA: Insufficient documentation

## 2021-08-15 HISTORY — DX: Essential (primary) hypertension: I10

## 2021-08-15 HISTORY — DX: Unspecified osteoarthritis, unspecified site: M19.90

## 2021-08-15 HISTORY — DX: Pure hypercholesterolemia, unspecified: E78.00

## 2021-08-15 HISTORY — DX: Chronic kidney disease, unspecified: N18.9

## 2021-08-15 LAB — BASIC METABOLIC PANEL
Anion gap: 7 (ref 5–15)
BUN: 22 mg/dL — ABNORMAL HIGH (ref 6–20)
CO2: 29 mmol/L (ref 22–32)
Calcium: 9.1 mg/dL (ref 8.9–10.3)
Chloride: 103 mmol/L (ref 98–111)
Creatinine, Ser: 1.06 mg/dL — ABNORMAL HIGH (ref 0.44–1.00)
GFR, Estimated: >60 mL/min (ref >60)
Glucose, Bld: 95 mg/dL (ref 70–99)
Potassium: 4.3 mmol/L (ref 3.5–5.1)
Sodium: 139 mmol/L (ref 135–145)

## 2021-08-15 LAB — CBC
HCT: 39.2 % (ref 36.0–46.0)
Hemoglobin: 12.6 g/dL (ref 12.0–15.0)
MCH: 31.6 pg (ref 26.0–34.0)
MCHC: 32.1 g/dL (ref 30.0–36.0)
MCV: 98.2 fL (ref 80.0–100.0)
Platelets: 148 10*3/uL — ABNORMAL LOW (ref 150–400)
RBC: 3.99 MIL/uL (ref 3.87–5.11)
RDW: 12.1 % (ref 11.5–15.5)
WBC: 3.9 10*3/uL — ABNORMAL LOW (ref 4.0–10.5)
nRBC: 0 % (ref 0.0–0.2)

## 2021-08-15 NOTE — Progress Notes (Signed)
COVID Vaccine Completed: Yes Date COVID Vaccine completed: 04/09/20 x 2 COVID vaccine manufacturer: Moderna     PCP - Horton Marshall: PA Cardiologist -   Chest x-ray - 11/14/20 EKG - 11/14/20 EPIC Stress Test -  ECHO -  Cardiac Cath -  Pacemaker/ICD device last checked:  Sleep Study - Yes CPAP - NO  Fasting Blood Sugar -  Checks Blood Sugar _____ times a day  Blood Thinner Instructions: Aspirin Instructions: Last Dose:  Anesthesia review:   Patient denies shortness of breath, fever, cough and chest pain at PAT appointment   Patient verbalized understanding of instructions that were given to them at the PAT appointment. Patient was also instructed that they will need to review over the PAT instructions again at home before surgery.

## 2021-08-16 DIAGNOSIS — N3941 Urge incontinence: Secondary | ICD-10-CM | POA: Diagnosis not present

## 2021-08-16 DIAGNOSIS — N393 Stress incontinence (female) (male): Secondary | ICD-10-CM | POA: Diagnosis not present

## 2021-08-16 DIAGNOSIS — N3281 Overactive bladder: Secondary | ICD-10-CM | POA: Diagnosis not present

## 2021-09-01 ENCOUNTER — Encounter (HOSPITAL_COMMUNITY): Payer: Self-pay | Admitting: Surgery

## 2021-09-01 ENCOUNTER — Ambulatory Visit (HOSPITAL_COMMUNITY): Payer: Medicare HMO | Admitting: Certified Registered Nurse Anesthetist

## 2021-09-01 ENCOUNTER — Ambulatory Visit (HOSPITAL_COMMUNITY)
Admission: RE | Admit: 2021-09-01 | Discharge: 2021-09-01 | Disposition: A | Discharge status: Home / Self Care | Payer: Medicare HMO | Attending: Surgery | Admitting: Surgery

## 2021-09-01 ENCOUNTER — Encounter (HOSPITAL_COMMUNITY)
Admission: RE | Disposition: A | Discharge status: Home / Self Care | Payer: Self-pay | Source: Home / Self Care | Attending: Surgery

## 2021-09-01 DIAGNOSIS — Z87891 Personal history of nicotine dependence: Secondary | ICD-10-CM | POA: Insufficient documentation

## 2021-09-01 DIAGNOSIS — Z79891 Long term (current) use of opiate analgesic: Secondary | ICD-10-CM | POA: Insufficient documentation

## 2021-09-01 DIAGNOSIS — Z9889 Other specified postprocedural states: Secondary | ICD-10-CM | POA: Insufficient documentation

## 2021-09-01 DIAGNOSIS — K219 Gastro-esophageal reflux disease without esophagitis: Secondary | ICD-10-CM | POA: Diagnosis not present

## 2021-09-01 DIAGNOSIS — G8929 Other chronic pain: Secondary | ICD-10-CM | POA: Diagnosis not present

## 2021-09-01 DIAGNOSIS — I129 Hypertensive chronic kidney disease with stage 1 through stage 4 chronic kidney disease, or unspecified chronic kidney disease: Secondary | ICD-10-CM | POA: Insufficient documentation

## 2021-09-01 DIAGNOSIS — N183 Chronic kidney disease, stage 3 unspecified: Secondary | ICD-10-CM | POA: Insufficient documentation

## 2021-09-01 DIAGNOSIS — N189 Chronic kidney disease, unspecified: Secondary | ICD-10-CM | POA: Diagnosis not present

## 2021-09-01 DIAGNOSIS — K420 Umbilical hernia with obstruction, without gangrene: Secondary | ICD-10-CM | POA: Insufficient documentation

## 2021-09-01 DIAGNOSIS — K4090 Unilateral inguinal hernia, without obstruction or gangrene, not specified as recurrent: Secondary | ICD-10-CM | POA: Diagnosis not present

## 2021-09-01 DIAGNOSIS — E7800 Pure hypercholesterolemia, unspecified: Secondary | ICD-10-CM | POA: Diagnosis not present

## 2021-09-01 DIAGNOSIS — K4030 Unilateral inguinal hernia, with obstruction, without gangrene, not specified as recurrent: Secondary | ICD-10-CM | POA: Diagnosis not present

## 2021-09-01 SURGERY — HERNIORRHAPHY, INGUINAL, ROBOT-ASSISTED, LAPAROSCOPIC
Anesthesia: General | Site: Abdomen | Laterality: Left

## 2021-09-01 MED ORDER — BUPIVACAINE LIPOSOME 1.3 % IJ SUSP
INTRAMUSCULAR | Status: AC
  Filled 2021-09-01: qty 20

## 2021-09-01 MED ORDER — FENTANYL CITRATE (PF) 100 MCG/2ML IJ SOLN
INTRAMUSCULAR | Status: DC | PRN
  Administered 2021-09-01: 100 ug via INTRAVENOUS
  Administered 2021-09-01 (×2): 50 ug via INTRAVENOUS

## 2021-09-01 MED ORDER — LIDOCAINE 2% (20 MG/ML) 5 ML SYRINGE
INTRAMUSCULAR | Status: AC
  Filled 2021-09-01: qty 5

## 2021-09-01 MED ORDER — CHLORHEXIDINE GLUCONATE CLOTH 2 % EX PADS: 6.0000 | MEDICATED_PAD | Freq: Once | CUTANEOUS | Status: DC

## 2021-09-01 MED ORDER — BUPIVACAINE-EPINEPHRINE (PF) 0.25% -1:200000 IJ SOLN
INTRAMUSCULAR | Status: AC
  Filled 2021-09-01: qty 30

## 2021-09-01 MED ORDER — FENTANYL CITRATE PF 50 MCG/ML IJ SOSY
PREFILLED_SYRINGE | INTRAMUSCULAR | Status: AC
  Filled 2021-09-01: qty 1

## 2021-09-01 MED ORDER — ONDANSETRON HCL 4 MG/2ML IJ SOLN
INTRAMUSCULAR | Status: DC | PRN
  Administered 2021-09-01: 4 mg via INTRAVENOUS

## 2021-09-01 MED ORDER — PHENYLEPHRINE 40 MCG/ML (10ML) SYRINGE FOR IV PUSH (FOR BLOOD PRESSURE SUPPORT)
PREFILLED_SYRINGE | INTRAVENOUS | Status: AC
  Filled 2021-09-01: qty 10

## 2021-09-01 MED ORDER — BUPIVACAINE LIPOSOME 1.3 % IJ SUSP: 20.0000 mL | Freq: Once | INTRAMUSCULAR | Status: DC

## 2021-09-01 MED ORDER — TRAMADOL HCL 50 MG PO TABS: 50.0000 mg | ORAL_TABLET | Freq: Four times a day (QID) | ORAL | 0 refills | Status: AC | PRN

## 2021-09-01 MED ORDER — MIDAZOLAM HCL 2 MG/2ML IJ SOLN
INTRAMUSCULAR | Status: AC
  Filled 2021-09-01: qty 2

## 2021-09-01 MED ORDER — ONDANSETRON HCL 4 MG/2ML IJ SOLN
INTRAMUSCULAR | Status: AC
  Filled 2021-09-01: qty 2

## 2021-09-01 MED ORDER — PROMETHAZINE HCL 25 MG/ML IJ SOLN: 6.2500 mg | INTRAMUSCULAR | Status: DC | PRN

## 2021-09-01 MED ORDER — LIDOCAINE 2% (20 MG/ML) 5 ML SYRINGE
INTRAMUSCULAR | Status: DC | PRN
  Administered 2021-09-01: 80 mg via INTRAVENOUS

## 2021-09-01 MED ORDER — FENTANYL CITRATE (PF) 100 MCG/2ML IJ SOLN
INTRAMUSCULAR | Status: AC
  Filled 2021-09-01: qty 2

## 2021-09-01 MED ORDER — SODIUM CHLORIDE 0.9% FLUSH: 3.0000 mL | INTRAVENOUS | Status: DC | PRN

## 2021-09-01 MED ORDER — OXYCODONE HCL 5 MG PO TABS
ORAL_TABLET | ORAL | Status: AC
  Filled 2021-09-01: qty 1

## 2021-09-01 MED ORDER — CEFAZOLIN SODIUM-DEXTROSE 2-4 GM/100ML-% IV SOLN
2.0000 g | INTRAVENOUS | Status: AC
  Administered 2021-09-01: 2 g via INTRAVENOUS
  Filled 2021-09-01: qty 100

## 2021-09-01 MED ORDER — DOCUSATE SODIUM 100 MG PO CAPS: 100.0000 mg | ORAL_CAPSULE | Freq: Two times a day (BID) | ORAL | 0 refills | Status: AC

## 2021-09-01 MED ORDER — SUGAMMADEX SODIUM 200 MG/2ML IV SOLN
INTRAVENOUS | Status: DC | PRN
  Administered 2021-09-01: 200 mg via INTRAVENOUS

## 2021-09-01 MED ORDER — BUPIVACAINE-EPINEPHRINE 0.25% -1:200000 IJ SOLN
INTRAMUSCULAR | Status: DC | PRN
  Administered 2021-09-01: 30 mL

## 2021-09-01 MED ORDER — OXYCODONE HCL 5 MG PO TABS
5.0000 mg | ORAL_TABLET | ORAL | Status: DC | PRN
  Administered 2021-09-01: 5 mg via ORAL

## 2021-09-01 MED ORDER — CHLORHEXIDINE GLUCONATE 0.12 % MT SOLN
15.0000 mL | Freq: Once | OROMUCOSAL | Status: AC
  Administered 2021-09-01: 15 mL via OROMUCOSAL

## 2021-09-01 MED ORDER — 0.9 % SODIUM CHLORIDE (POUR BTL) OPTIME
TOPICAL | Status: DC | PRN
  Administered 2021-09-01: 1000 mL

## 2021-09-01 MED ORDER — SODIUM CHLORIDE 0.9% FLUSH
3.0000 mL | Freq: Two times a day (BID) | INTRAVENOUS | Status: DC
  Administered 2021-09-01: 3 mL via INTRAVENOUS

## 2021-09-01 MED ORDER — PHENYLEPHRINE 40 MCG/ML (10ML) SYRINGE FOR IV PUSH (FOR BLOOD PRESSURE SUPPORT)
PREFILLED_SYRINGE | INTRAVENOUS | Status: DC | PRN
  Administered 2021-09-01 (×2): 60 ug via INTRAVENOUS

## 2021-09-01 MED ORDER — BUPIVACAINE LIPOSOME 1.3 % IJ SUSP
INTRAMUSCULAR | Status: DC | PRN
  Administered 2021-09-01: 20 mL

## 2021-09-01 MED ORDER — ROCURONIUM BROMIDE 10 MG/ML (PF) SYRINGE
PREFILLED_SYRINGE | INTRAVENOUS | Status: AC
  Filled 2021-09-01: qty 10

## 2021-09-01 MED ORDER — SODIUM CHLORIDE 0.9 % IV SOLN: 250.0000 mL | INTRAVENOUS | Status: DC | PRN

## 2021-09-01 MED ORDER — DEXAMETHASONE SODIUM PHOSPHATE 10 MG/ML IJ SOLN
INTRAMUSCULAR | Status: AC
  Filled 2021-09-01: qty 1

## 2021-09-01 MED ORDER — EPHEDRINE SULFATE-NACL 50-0.9 MG/10ML-% IV SOSY
PREFILLED_SYRINGE | INTRAVENOUS | Status: DC | PRN
  Administered 2021-09-01: 5 mg via INTRAVENOUS
  Administered 2021-09-01: 10 mg via INTRAVENOUS
  Administered 2021-09-01 (×2): 5 mg via INTRAVENOUS

## 2021-09-01 MED ORDER — ROCURONIUM BROMIDE 10 MG/ML (PF) SYRINGE
PREFILLED_SYRINGE | INTRAVENOUS | Status: DC | PRN
  Administered 2021-09-01: 50 mg via INTRAVENOUS
  Administered 2021-09-01 (×2): 10 mg via INTRAVENOUS

## 2021-09-01 MED ORDER — MIDAZOLAM HCL 5 MG/5ML IJ SOLN
INTRAMUSCULAR | Status: DC | PRN
  Administered 2021-09-01: 1 mg via INTRAVENOUS

## 2021-09-01 MED ORDER — FENTANYL CITRATE PF 50 MCG/ML IJ SOSY
25.0000 ug | PREFILLED_SYRINGE | INTRAMUSCULAR | Status: DC | PRN
  Administered 2021-09-01 (×2): 50 ug via INTRAVENOUS

## 2021-09-01 MED ORDER — ORAL CARE MOUTH RINSE: 15.0000 mL | Freq: Once | OROMUCOSAL | Status: AC

## 2021-09-01 MED ORDER — PROPOFOL 10 MG/ML IV BOLUS
INTRAVENOUS | Status: DC | PRN
  Administered 2021-09-01: 140 mg via INTRAVENOUS

## 2021-09-01 MED ORDER — LACTATED RINGERS IV SOLN
INTRAVENOUS | Status: DC
Start: 2021-09-01 — End: 2021-09-01

## 2021-09-01 MED ORDER — PROPOFOL 10 MG/ML IV BOLUS
INTRAVENOUS | Status: AC
  Filled 2021-09-01: qty 20

## 2021-09-01 MED ORDER — ACETAMINOPHEN 500 MG PO TABS
1000.0000 mg | ORAL_TABLET | ORAL | Status: AC
  Administered 2021-09-01: 1000 mg via ORAL
  Filled 2021-09-01: qty 2

## 2021-09-01 MED ORDER — DEXAMETHASONE SODIUM PHOSPHATE 4 MG/ML IJ SOLN
INTRAMUSCULAR | Status: DC | PRN
  Administered 2021-09-01: 5 mg via INTRAVENOUS

## 2021-09-01 MED ORDER — EPHEDRINE 5 MG/ML INJ
INTRAVENOUS | Status: AC
  Filled 2021-09-01: qty 5

## 2021-09-01 SURGICAL SUPPLY — 43 items
APPLICATOR COTTON TIP 6 STRL (MISCELLANEOUS) ×2 IMPLANT
APPLICATOR COTTON TIP 6IN STRL (MISCELLANEOUS) ×6
BAG COUNTER SPONGE SURGICOUNT (BAG) IMPLANT
BAG SURGICOUNT SPONGE COUNTING (BAG)
BLADE SURG SZ11 CARB STEEL (BLADE) ×3 IMPLANT
CHLORAPREP W/TINT 26 (MISCELLANEOUS) ×3 IMPLANT
COVER SURGICAL LIGHT HANDLE (MISCELLANEOUS) ×3 IMPLANT
COVER TIP SHEARS 8 DVNC (MISCELLANEOUS) ×1 IMPLANT
COVER TIP SHEARS 8MM DA VINCI (MISCELLANEOUS) ×3
DECANTER SPIKE VIAL GLASS SM (MISCELLANEOUS) ×3 IMPLANT
DRAPE ARM DVNC X/XI (DISPOSABLE) ×4 IMPLANT
DRAPE COLUMN DVNC XI (DISPOSABLE) ×1 IMPLANT
DRAPE DA VINCI XI ARM (DISPOSABLE) ×12
DRAPE DA VINCI XI COLUMN (DISPOSABLE) ×2
ELECT REM PT RETURN 15FT ADLT (MISCELLANEOUS) ×3 IMPLANT
GLOVE SURG ENC MOIS LTX SZ6 (GLOVE) ×6 IMPLANT
GLOVE SURG MICRO LTX SZ6 (GLOVE) ×3 IMPLANT
GLOVE SURG UNDER LTX SZ6.5 (GLOVE) ×6 IMPLANT
GOWN STRL REUS W/TWL LRG LVL3 (GOWN DISPOSABLE) ×6 IMPLANT
GOWN STRL REUS W/TWL XL LVL3 (GOWN DISPOSABLE) ×3 IMPLANT
KIT BASIN OR (CUSTOM PROCEDURE TRAY) ×3 IMPLANT
KIT TURNOVER KIT A (KITS) ×3 IMPLANT
MESH 3DMAX 4X6 LT LRG (Mesh General) ×3 IMPLANT
NEEDLE HYPO 22GX1.5 SAFETY (NEEDLE) ×3 IMPLANT
NEEDLE INSUFFLATION 14GA 120MM (NEEDLE) IMPLANT
PACK CARDIOVASCULAR III (CUSTOM PROCEDURE TRAY) ×3 IMPLANT
PAD POSITIONING PINK XL (MISCELLANEOUS) IMPLANT
SEAL CANN UNIV 5-8 DVNC XI (MISCELLANEOUS) ×4 IMPLANT
SEAL XI 5MM-8MM UNIVERSAL (MISCELLANEOUS) ×12
SET IRRIG TUBING LAPAROSCOPIC (IRRIGATION / IRRIGATOR) IMPLANT
SOL ANTI FOG 6CC (MISCELLANEOUS) ×1 IMPLANT
SOLUTION ANTI FOG 6CC (MISCELLANEOUS) ×2
SOLUTION ELECTROLUBE (MISCELLANEOUS) ×3 IMPLANT
SPONGE T-LAP 18X18 ~~LOC~~+RFID (SPONGE) ×3 IMPLANT
SUT MNCRL AB 4-0 PS2 18 (SUTURE) ×3 IMPLANT
SUT VIC AB 3-0 SH 27 (SUTURE) ×6
SUT VIC AB 3-0 SH 27XBRD (SUTURE) ×2 IMPLANT
SUT VLOC 3-0 9IN GRN (SUTURE) IMPLANT
SYR 10ML LL (SYRINGE) ×3 IMPLANT
SYR 20ML LL LF (SYRINGE) ×3 IMPLANT
TOWEL OR 17X26 10 PK STRL BLUE (TOWEL DISPOSABLE) ×3 IMPLANT
TOWEL OR NON WOVEN STRL DISP B (DISPOSABLE) ×3 IMPLANT
TUBING INSUFFLATION 10FT LAP (TUBING) ×3 IMPLANT

## 2021-09-01 NOTE — Anesthesia Procedure Notes (Signed)
Procedure Name: Intubation Date/Time: 09/01/2021 9:23 AM Performed by: Vanessa Curlew, CRNA Pre-anesthesia Checklist: Patient identified, Emergency Drugs available, Suction available and Patient being monitored Patient Re-evaluated:Patient Re-evaluated prior to induction Oxygen Delivery Method: Circle system utilized Preoxygenation: Pre-oxygenation with 100% oxygen Induction Type: IV induction Ventilation: Mask ventilation without difficulty Laryngoscope Size: 2 and Miller Grade View: Grade II Tube type: Oral Tube size: 7.0 mm Number of attempts: 1 Airway Equipment and Method: Stylet Placement Confirmation: ETT inserted through vocal cords under direct vision, positive ETCO2 and breath sounds checked- equal and bilateral Tube secured with: Tape Dental Injury: Teeth and Oropharynx as per pre-operative assessment  Difficulty Due To: Difficult Airway- due to anterior larynx

## 2021-09-01 NOTE — Discharge Instructions (Signed)
HERN[facility] REPAIR: POST OP INSTRUCTIONS ? ? ?EAT ?Gradually transition to a high fiber diet with a fiber supplement over the next few weeks after discharge.  Start with a pureed / full liquid diet (see below) ? ?WALK ?Walk an hour a day (cumulative- not all at once).  Control your pain to do that.   ? ?CONTROL PAIN ?Control pain so that you can walk, sleep, tolerate sneezing/coughing, and go up/down stairs. ? ?HAVE A BOWEL MOVEMENT DAILY ?Keep your bowels regular to avoid problems.  OK to try a laxative to override constipation.  OK to use an antidairrheal to slow down diarrhea.  Call if not better after 2 tries ? ?CALL IF YOU HAVE PROBLEMS/CONCERNS ?Call if you are still struggling despite following these instructions. ?Call if you have concerns not answered by these instructions ? ?###################################################################### ? ? ? ?DIET: Follow a light bland diet & liquids the first 24 hours after arrival home, such as soup, liquids, starches, etc.  Be sure to drink plenty of fluids.  Quickly advance to a usual solid diet within a few days.  Avoid fast food or heavy meals as your are more likely to get nauseated or have irregular bowels.  A low-sugar, high-fiber diet for the rest of your life is ideal.  ? ?Take your usually prescribed home medications unless otherwise directed. ? ?PAIN CONTROL: ?Pain is best controlled by a usual combination of three different methods TOGETHER: ?Ice/Heat ?Over the counter pain medication ?Prescription pain medication ?Most patients will experience some swelling and bruising around the hernia(s) such as the bellybutton, groins, or old incisions.  Ice packs or heating pads (30-60 minutes up to 6 times a day) will help. Use ice for the first few days to help decrease swelling and bruising, then switch to heat to help relax tight/sore spots and speed recovery.  Some people prefer to use ice alone, heat alone, alternating between ice & heat.  Experiment to what  works for you.  Swelling and bruising can take several weeks to resolve.   ?It is helpful to take an over-the-counter pain medication regularly for the first days: ?Naproxen (Aleve, etc)  Two 220mg tabs twice a day OR Ibuprofen (Advil, etc) Three 200mg tabs four times a day (every meal & bedtime) AND ?Acetaminophen (Tylenol, etc) 325-650mg four times a day (every meal & bedtime) ?A  prescription for pain medication should be given to you upon discharge.  Take your pain medication as prescribed, IF NEEDED.  ?If you are having problems/concerns with the prescription medicine (does not control pain, nausea, vomiting, rash, itching, etc), please call us [phone] to see if we need to switch you to a different pain medicine that will work better for you and/or control your side effect better. ?If you need a refill on your pain medication, please contact your pharmacy.  They will contact our office to request authorization. Prescriptions will not be filled after 5 pm or on week-ends. ? ?Avoid getting constipated.  Between the surgery and the pain medications, it is common to experience some constipation.  Increasing fluid intake and taking a fiber supplement (such as Metamucil, Citrucel, FiberCon, MiraLax, etc) 1-2 times a day regularly will usually help prevent this problem from occurring.  A mild laxative (prune juice, Milk of Magnesia, MiraLax, etc) should be taken according to package directions if there are no bowel movements after 48 hours.   ? ?Wash / shower every day, starting 2 days after surgery.  You may shower over the   skin glue which is waterproof.   ? ?Glue will flake off after 1-2 weeks.  You may leave the incision open to air.  You may replace a dressing/Band-Aid to cover an incision for comfort if you wish.  Continue to shower over incision(s) after the dressing is off. ? ?ACTIVITIES as tolerated:   ?You may resume regular (light) daily activities beginning the next day--such as daily self-care,  walking, climbing stairs--gradually increasing activities as tolerated.  Control your pain so that you can walk an hour a day.  If you can walk 30 minutes without difficulty, it is safe to try more intense activity such as jogging, treadmill, bicycling, low-impact aerobics, swimming, etc. ?Refrain from the most intensive and strenuous activity such as sit-ups, heavy lifting, contact sports, etc  Refrain from any heavy lifting or straining until 6 weeks after surgery.   ?DO NOT PUSH THROUGH PAIN.  Let pain be your guide: If it hurts to do something, don't do it.  Pain is your body warning you to avoid that activity for another week until the pain goes down. ?You may drive when you are no longer taking prescription pain medication, you can comfortably wear a seatbelt, and you can safely maneuver your car and apply brakes. ?You may have sexual intercourse when it is comfortable.  ? ?FOLLOW UP in our office ?Please call [facility] at [phone] to set up an appointment to see your surgeon in the office for a follow-up appointment approximately 2-3 weeks after your surgery. ?Make sure that you call for this appointment the day you arrive home to insure a convenient appointment time. ? ?9.  If you have disability of FMLA / Family leave forms, please bring the forms to the office for processing.  (do not give to your surgeon). ? ?WHEN TO CALL US [phone]: ?Poor pain control ?Reactions / problems with new medications (rash/itching, nausea, etc)  ?Fever over 101.5 F (38.5 C) ?Inability to urinate ?Nausea and/or vomiting ?Worsening swelling or bruising ?Continued bleeding from incision. ?Increased pain, redness, or drainage from the incision ? ? The clinic staff is available to answer your questions during regular business hours (8:30am-5pm).  Please don?t hesitate to call and ask to speak to one of our nurses for clinical concerns.  ? If you have a medical emergency, go to the nearest emergency room or call 911. ? A  surgeon from [facility] is always on call at the hospitals in [place] ? ?[facility] ?[address], [address], [place], [place]  [address] ? ? [address] [address], [place], [place]   [address] ?MAIN: [phone] ? TOLL FREE: [phone] ? FAX: [phone] ?[email or web address]  ?

## 2021-09-01 NOTE — Op Note (Signed)
Operative Note  Brandy Delacruz  [id number]  [id number]  09/01/2021   Surgeon: Phylliss Blakes MD FACS   Procedure performed: xi robotic left inguinal hernia repair   Preop diagnosis: Left inguinal hernia Post-op diagnosis/intraop findings: Same, indirect, containing incarcerated medial umbilical ligament   Specimens: no Retained items: no  EBL: minimal cc Complications: none   Description of procedure: After obtaining informed consent the patient was taken to the operating room and placed supine on operating room table where general endotracheal anesthesia was initiated, preoperative antibiotics were administered, SCDs applied, and a formal timeout was performed.  Foley catheter was inserted which is removed at the end of the case.  The abdomen was prepped and draped in usual sterile fashion.  Peritoneal access was gained with a left subcostal Veress needle and insufflation to 15 mmHg ensued without incident.  A supraumbilical 8 mm trocar and camera were then inserted and the abdominal cavity inspected.  There was some insufflation along the colon mesentery; the colon was closely inspected and again inspected at the end of the case and confirmed to be free of any injury.  Bilateral laparoscopic assisted taps blocks with Exparel mixed with quarter percent Marcaine with epinephrine were performed and bilateral 8 mm robotic trochars were inserted under direct visualization.  Inspection of the pelvis confirmed a left indirect hernia, no hernia on the right side, no significant adhesions.  The patient was then placed in Trendelenburg position and the robot docked, all robotic instruments were inserted under direct visualization.  A peritoneal flap was then developed from approximately the medial umbilical ligament to the anterior superior iliac spine about 6 cm above the hernia defect.  This was further dissected bluntly and with scissors and cautery until the hernia sac was reduced, which was  somewhat difficult as it was a very long tubular chronically incarcerated sac containing preperitoneal fat/medial umbilical ligament tissue, but ultimately this was able to be reduced intact.  The round ligament had been previously divided during her hysterectomy.The peritoneal flap was further developed until the iliac vessels were exposed and the space of Retzius was developed so that the bladder was carefully bluntly freed from the anterior abdominal wall creating a pocket for the mesh medially; the Cooper's ligament was exposed and noted to have a medium sized crossing vein along the lateral aspect.  There was a small amount of preperitoneal fat along the inferior epigastrics which seemed like it might be able to incarcerate itself within the hernia defect so this was excised with cautery and sharp dissection.  The field was inspected and hemostasis was confirmed.  There was excellent exposure of the defect and the myopectineal orifice.  A Bard 3D max large left-sided mesh was then placed within the field and this was secured to the Cooper's ligament as well as superiorly on either side of the inferior epigastric vessels with interrupted 3-0 Vicryl sutures.  The peritoneal flap was then brought back up to cover the mesh, ensuring that the mesh did not fold or buckle while doing so.  The peritoneum was closed with a running imbricating 3-0 Vloc.  On completion the mesh is completely covered.  The suture and needles were removed under direct visualization.  The robot was then undocked and the abdomen desufflated.  Skin incisions were closed with subcuticular 4-0 Monocryl and Dermabond. The patient was then awakened, extubated and taken to PACU in stable condition.    All counts were correct at the completion of the case.

## 2021-09-01 NOTE — Anesthesia Preprocedure Evaluation (Addendum)
Anesthesia Evaluation  Patient identified by MRN, date of birth, ID band Patient awake    Reviewed: Allergy & Precautions, NPO status , Patient's Chart, lab work & pertinent test results  Airway Mallampati: II  TM Distance: >3 FB Neck ROM: Full    Dental  (+) Teeth Intact, Dental Advisory Given,    Pulmonary former smoker,    Pulmonary exam normal breath sounds clear to auscultation       Cardiovascular hypertension, (-) angina(-) Past MI Normal cardiovascular exam Rhythm:Regular Rate:Normal     Neuro/Psych PSYCHIATRIC DISORDERS Anxiety Depression negative neurological ROS     GI/Hepatic Neg liver ROS, GERD  Medicated and Controlled,LEFT INGUINAL HERNIA   Endo/Other  Obesity   Renal/GU Renal InsufficiencyRenal disease   S/p bladder stimulator     Musculoskeletal  (+) Arthritis ,   Abdominal   Peds  Hematology negative hematology ROS (+)   Anesthesia Other Findings Day of surgery medications reviewed with the patient.  Reproductive/Obstetrics                            Anesthesia Physical Anesthesia Plan  ASA: 2  Anesthesia Plan: General   Post-op Pain Management:    Induction: Intravenous  PONV Risk Score and Plan: 4 or greater and Midazolam, Dexamethasone and Ondansetron  Airway Management Planned: Oral ETT  Additional Equipment:   Intra-op Plan:   Post-operative Plan: Extubation in OR  Informed Consent: I have reviewed the patients History and Physical, chart, labs and discussed the procedure including the risks, benefits and alternatives for the proposed anesthesia with the patient or authorized representative who has indicated his/her understanding and acceptance.     Dental advisory given  Plan Discussed with: CRNA  Anesthesia Plan Comments:         Anesthesia Quick Evaluation

## 2021-09-01 NOTE — H&P (Signed)
Surgical Evaluation Requesting provider: Horton Marshall, PA [facility])   Chief Complaint: Hernia   HPI: 60 year old woman referred for evaluation of left groin pain and reducible bulge consistent with hernia.  The bulge gets larger throughout the day and is very tender especially when she tries to reduce it.  She does have chronic pain and takes morphine along with several other medications..  Additional history includes anxiety and depression, hyperlipidemia, insomnia, vitamin D deficiency, chronic pain, arthritis, stress and urge incontinence, GERD, and CKD 3.    She states that she has actually had pain in the left groin for several years, and that it has been attributed to scar tissue from her previous laparoscopies for endometriosis/her C-section.  A couple months ago however she noticed a bulge in this area.  This reduces when she is supine, and she is able to manually reduce it as well.  This is exquisitely tender.  She has had some intermittent nausea, but denies other GI symptoms.   No Known Allergies       Past Medical History:  Diagnosis Date   Anxiety     Depression     GERD (gastroesophageal reflux disease)             Past Surgical History:  Procedure Laterality Date   ABDOMINAL HYSTERECTOMY       BACK SURGERY       bladder stimulator       KNEE SURGERY Right     NECK SURGERY               Family History  Problem Relation Age of Onset   Diabetes Mother     Stroke Mother        Social History         Socioeconomic History   Marital status: Legally Separated      Spouse name: Not on file   Number of children: Not on file   Years of education: Not on file   Highest education level: Not on file  Occupational History   Not on file  Tobacco Use   Smoking status: Former   Smokeless tobacco: Never  Vaping Use   Vaping Use: Never used  Substance and Sexual Activity   Alcohol use: Yes      Comment: occ   Drug use: Never   Sexual activity: Not on file   Other Topics Concern   Not on file  Social History Narrative   Not on file    Social Determinants of Health    Financial Resource Strain: Not on file  Food Insecurity: Not on file  Transportation Needs: Not on file  Physical Activity: Not on file  Stress: Not on file  Social Connections: Not on file            Current Outpatient Medications on File Prior to Visit  Medication Sig Dispense Refill   sucralfate (CARAFATE) 1 g tablet Take 1 tablet (1 g total) by mouth 4 (four) times daily as needed. 30 tablet 0    No current facility-administered medications on file prior to visit.      Review of Systems: a complete, 10pt review of systems was completed with pertinent positives and negatives as documented in the HPI   Physical Exam: There were no vitals filed for this visit. Gen: A&Ox3, no distress  Eyes: lids and conjunctivae normal, no icterus. Pupils equally round and reactive to light.  Neck: supple without mass or thyromegaly Chest: respiratory effort is normal. No  crepitus or tenderness on palpation of the chest. Breath sounds equal.  Cardiovascular: RRR with palpable distal pulses, no pedal edema Gastrointestinal: soft, nondistended, nontender. No mass, hepatomegaly or splenomegaly. Exquisitely tender but reducible left inguinal hernia. Lymphatic: no lymphadenopathy in the neck or groin Muscoloskeletal: no clubbing or cyanosis of the fingers.  Strength is symmetrical throughout.  Range of motion of bilateral upper and lower extremities normal without pain, crepitation or contracture. Neuro: cranial nerves grossly intact.  Sensation intact to light touch diffusely. Psych: appropriate mood and affect, normal insight/judgment intact  Skin: warm and dry     CBC Latest Ref Rng & Units 11/14/2020  WBC 4.0 - 10.5 K/uL 5.5  Hemoglobin 12.0 - 15.0 g/dL 47.6  Hematocrit 54.6 - 46.0 % 39.4  Platelets 150 - 400 K/uL 198      CMP Latest Ref Rng & Units 11/14/2020  Glucose 70 -  99 mg/dL 503(T)  BUN 6 - 20 mg/dL 46(F)  Creatinine 6.81 - 1.00 mg/dL 2.75(T)  Sodium 700 - 174 mmol/L 142  Potassium 3.5 - 5.1 mmol/L 4.3  Chloride 98 - 111 mmol/L 105  CO2 22 - 32 mmol/L 29  Calcium 8.9 - 10.3 mg/dL 9.5  Total Protein 6.5 - 8.1 g/dL 7.7  Total Bilirubin 0.3 - 1.2 mg/dL 0.6  Alkaline Phos 38 - 126 U/L 63  AST 15 - 41 U/L 31  ALT 0 - 44 U/L 27      Recent Labs  No results found for: INR, PROTIME     Imaging: Imaging Results (Last 48 hours)  No results found.       A/P: Symptomatic left inguinal hernia.  I recommend repair.  We discussed the options of laparoscopic/robotic or open.  Given her habitus and chronic pain history, I recommend a minimally invasive approach.  We went over the relevant anatomy, surgical technique, and risks of bleeding, infection, pain, scarring, injury to intra-abdominal or retroperitoneal structures, chronic pain, hernia recurrence, as well as general cardiovascular/thromboembolic/pulmonary complications.  Questions welcomed and answered.  Schedule surgery at her convenience.       Phylliss Blakes, MD [facility], [place]

## 2021-09-01 NOTE — Anesthesia Postprocedure Evaluation (Signed)
Anesthesia Post Note  Patient: Brandy Delacruz  Procedure(s) Performed: ROBOTIC LEFT INGUINAL HERNIA REPAIR WITH MESH (Left: Abdomen)     Patient location during evaluation: PACU Anesthesia Type: General Level of consciousness: awake and alert Pain management: pain level controlled Vital Signs Assessment: post-procedure vital signs reviewed and stable Respiratory status: spontaneous breathing, nonlabored ventilation, respiratory function stable and patient connected to nasal cannula oxygen Cardiovascular status: blood pressure returned to baseline and stable Postop Assessment: no apparent nausea or vomiting Anesthetic complications: no   No notable events documented.  Last Vitals:  Vitals:   09/01/21 1200 09/01/21 1205  BP: (!) 143/96 (!) 142/94  Pulse: 72   Resp: 10 10  Temp: (!) 36.3 C   SpO2: 94% 94%    Last Pain:  Vitals:   09/01/21 1205  TempSrc:   PainSc: 4                  Cecile Hearing

## 2021-09-01 NOTE — Transfer of Care (Signed)
Immediate Anesthesia Transfer of Care Note  Patient: Brandy Delacruz  Procedure(s) Performed: ROBOTIC LEFT INGUINAL HERNIA REPAIR WITH MESH (Left: Abdomen)  Patient Location: PACU  Anesthesia Type:General  Level of Consciousness: awake and patient cooperative  Airway & Oxygen Therapy: Patient Spontanous Breathing and Patient connected to face mask  Post-op Assessment: Report given to RN and Post -op Vital signs reviewed and stable  Post vital signs: Reviewed and stable  Last Vitals:  Vitals Value Taken Time  BP 148/91 09/01/21 1115  Temp    Pulse 85 09/01/21 1115  Resp 15 09/01/21 1115  SpO2 100 % 09/01/21 1115  Vitals shown include unvalidated device data.  Last Pain:  Vitals:   09/01/21 0756  TempSrc:   PainSc: 0-No pain      Patients Stated Pain Goal: 4 (09/01/21 0756)  Complications: No notable events documented.

## 2021-10-10 DIAGNOSIS — M47816 Spondylosis without myelopathy or radiculopathy, lumbar region: Secondary | ICD-10-CM | POA: Diagnosis not present

## 2021-10-10 DIAGNOSIS — M5432 Sciatica, left side: Secondary | ICD-10-CM | POA: Diagnosis not present

## 2021-10-10 DIAGNOSIS — M25512 Pain in left shoulder: Secondary | ICD-10-CM | POA: Diagnosis not present

## 2021-10-10 DIAGNOSIS — M4712 Other spondylosis with myelopathy, cervical region: Secondary | ICD-10-CM | POA: Diagnosis not present

## 2021-10-26 DIAGNOSIS — M5412 Radiculopathy, cervical region: Secondary | ICD-10-CM | POA: Diagnosis not present

## 2021-10-26 DIAGNOSIS — Z6837 Body mass index (BMI) 37.0-37.9, adult: Secondary | ICD-10-CM | POA: Diagnosis not present

## 2021-11-09 ENCOUNTER — Other Ambulatory Visit: Payer: Self-pay | Admitting: Physician Assistant

## 2021-11-09 ENCOUNTER — Other Ambulatory Visit (HOSPITAL_COMMUNITY): Payer: Self-pay | Admitting: Physician Assistant

## 2021-11-09 DIAGNOSIS — M47816 Spondylosis without myelopathy or radiculopathy, lumbar region: Secondary | ICD-10-CM | POA: Diagnosis not present

## 2021-11-09 DIAGNOSIS — M4712 Other spondylosis with myelopathy, cervical region: Secondary | ICD-10-CM | POA: Diagnosis not present

## 2021-11-09 DIAGNOSIS — M25512 Pain in left shoulder: Secondary | ICD-10-CM | POA: Diagnosis not present

## 2021-11-09 DIAGNOSIS — M5412 Radiculopathy, cervical region: Secondary | ICD-10-CM

## 2021-11-09 DIAGNOSIS — M5432 Sciatica, left side: Secondary | ICD-10-CM | POA: Diagnosis not present

## 2021-11-14 ENCOUNTER — Other Ambulatory Visit: Payer: Self-pay

## 2021-11-14 ENCOUNTER — Ambulatory Visit (HOSPITAL_COMMUNITY)
Admission: RE | Admit: 2021-11-14 | Discharge: 2021-11-14 | Disposition: A | Discharge status: Home / Self Care | Payer: Medicare HMO | Source: Ambulatory Visit | Attending: Physician Assistant | Admitting: Physician Assistant

## 2021-11-14 DIAGNOSIS — Z981 Arthrodesis status: Secondary | ICD-10-CM | POA: Diagnosis not present

## 2021-11-14 DIAGNOSIS — M50123 Cervical disc disorder at C6-C7 level with radiculopathy: Secondary | ICD-10-CM | POA: Diagnosis not present

## 2021-11-14 DIAGNOSIS — M5412 Radiculopathy, cervical region: Secondary | ICD-10-CM | POA: Diagnosis not present

## 2021-11-14 DIAGNOSIS — M25512 Pain in left shoulder: Secondary | ICD-10-CM | POA: Diagnosis not present

## 2021-11-14 DIAGNOSIS — M4802 Spinal stenosis, cervical region: Secondary | ICD-10-CM | POA: Diagnosis not present

## 2021-11-14 IMAGING — MR MR CERVICAL SPINE W/O CM
4 of 6 series · 31 of 48 positions shown · non-contrast
Comparison: No pertinent prior exams available for comparison.

CLINICAL DATA: Cervical radiculopathy. Additional history provided:
Patient reports history of cervical fusion, pain radiating to left
shoulder/biceps area.

EXAM:
MRI CERVICAL SPINE WITHOUT CONTRAST
TECHNIQUE: Multiplanar, multisequence MR imaging of the cervical spine was
performed. No intravenous contrast was administered.

[Series 5: T1 · sagittal · 3.0mm · 0.69mm/px · 5 of 15 slices shown (1 of 2)]
[im 1/15]
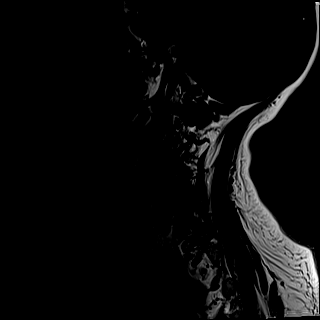
[im 4/15]
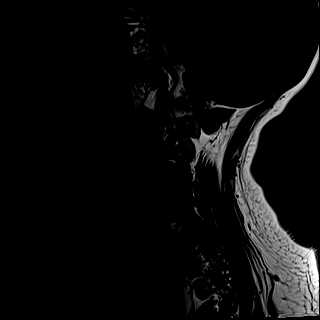
[im 8/15]
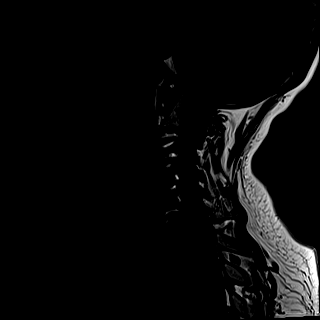
[im 11/15]
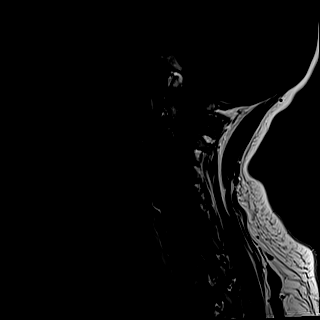
[im 15/15]
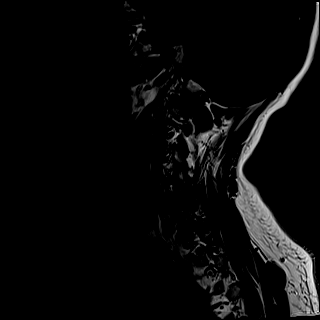

[Series 6: T2 · sagittal · 3.0mm · 0.69mm/px · 5 of 15 slices shown (1 of 2)]
[im 1/15]
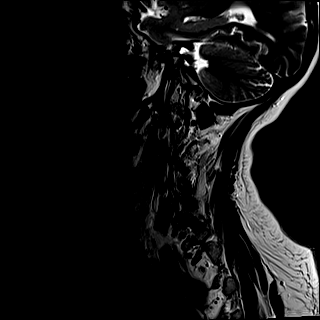
[im 4/15]
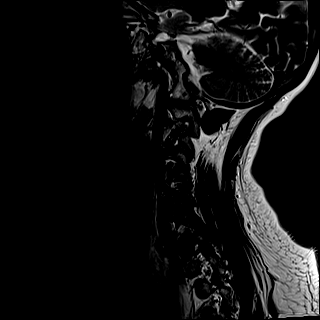
[im 8/15]
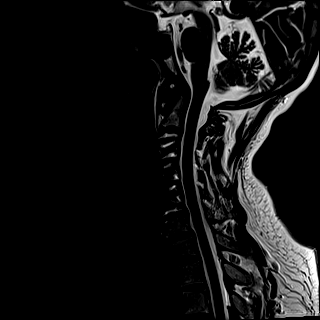
[im 11/15]
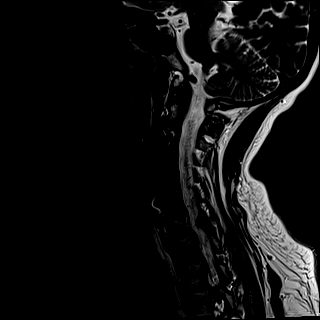
[im 15/15]
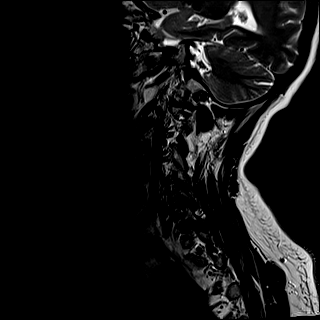

[Series 8: T2 · axial · 3.0mm · 0.70mm/px · z∈[-51,+55]mm · 11 of 31 slices shown (2 of 2)]
[im 1/31]
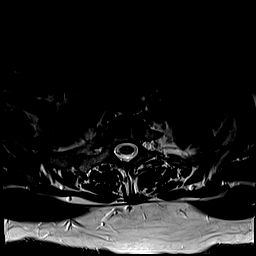
[im 4/31]
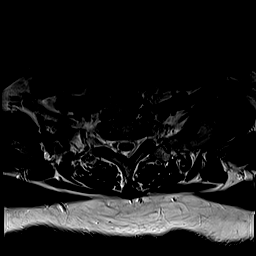
[im 7/31]
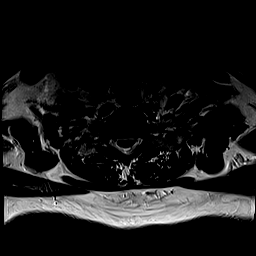
[im 10/31]
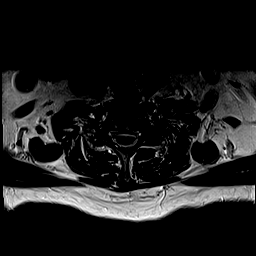
[im 13/31]
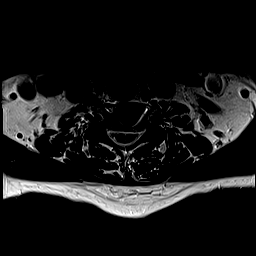
[im 16/31]
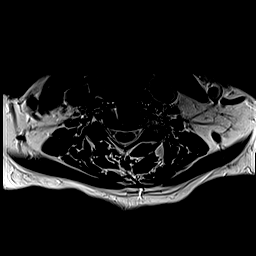
[im 19/31]
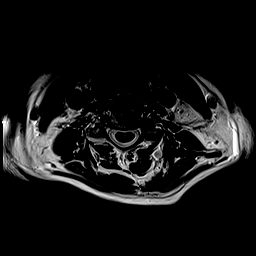
[im 22/31]
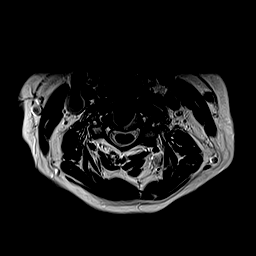
[im 25/31]
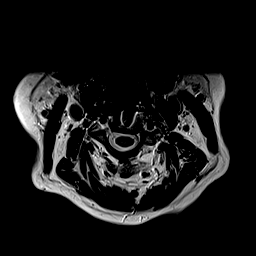
[im 28/31]
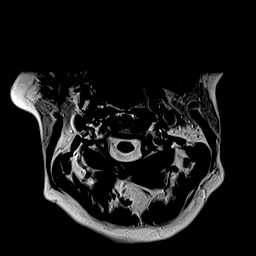
[im 31/31]
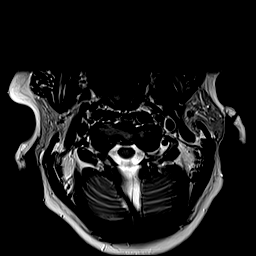

[Series 10: T1 · axial · 3.0mm · 0.35mm/px · z∈[-51,+41]mm · 10 of 32 slices shown (2 of 2)]
[im 1/32]
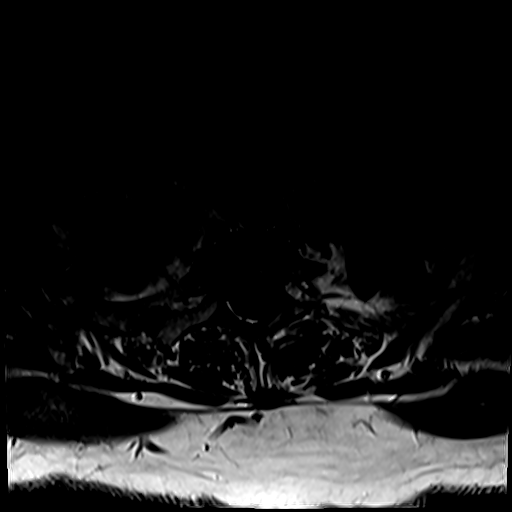
[im 4/32]
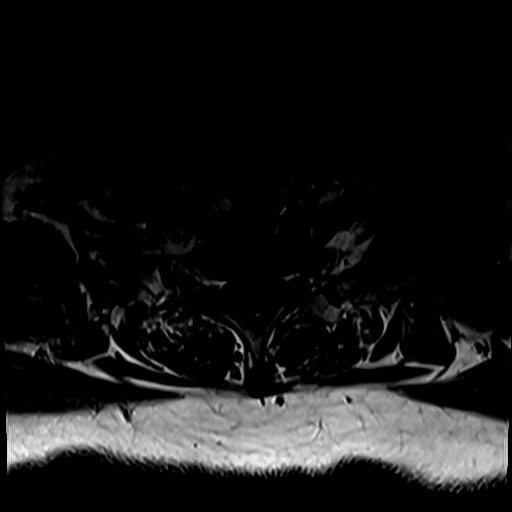
[im 7/32]
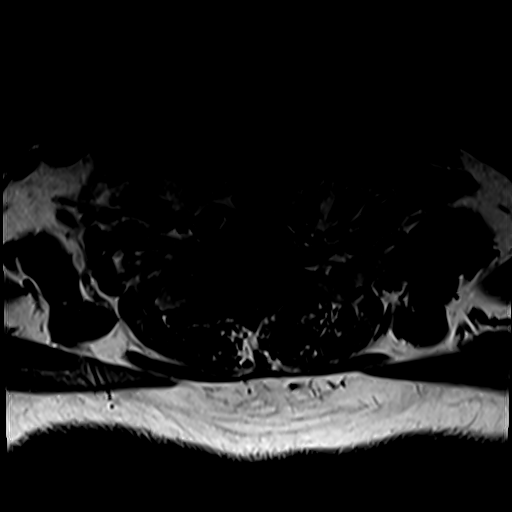
[im 10/32]
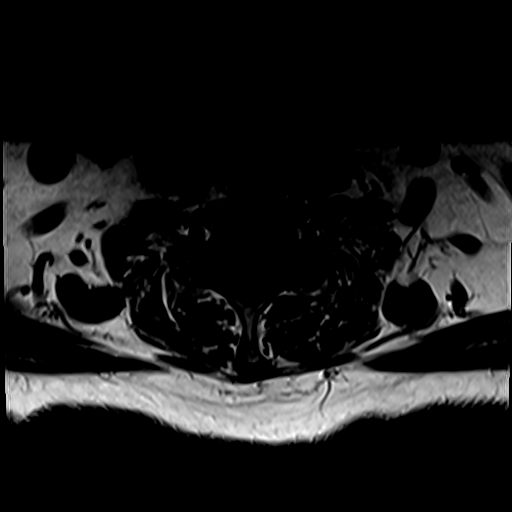
[im 13/32]
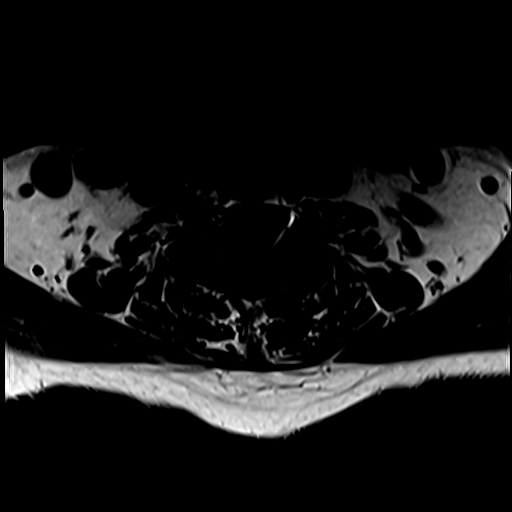
[im 16/32]
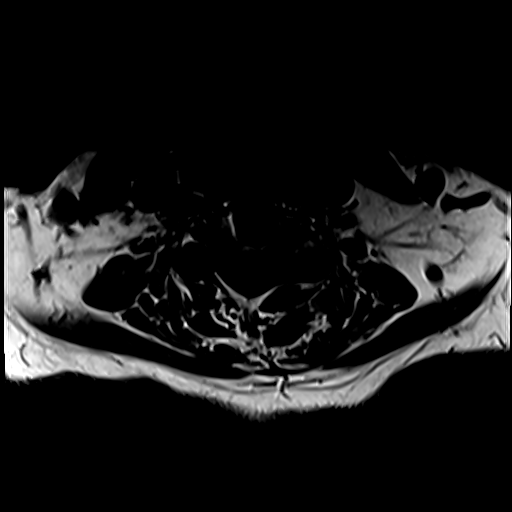
[im 19/32]
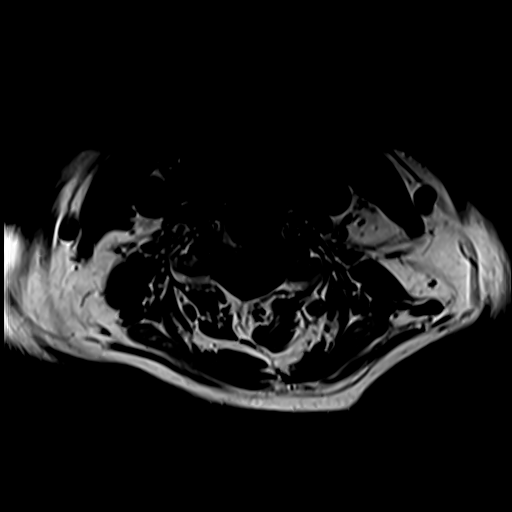
[im 22/32]
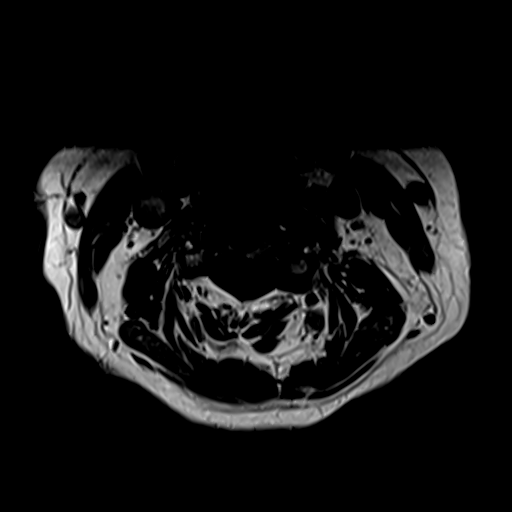
[im 25/32]
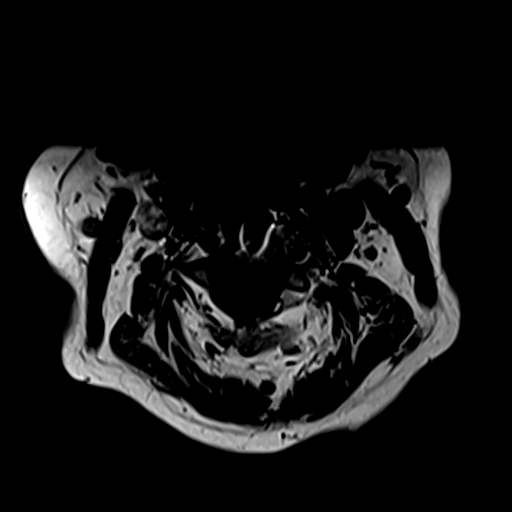
[im 28/32]
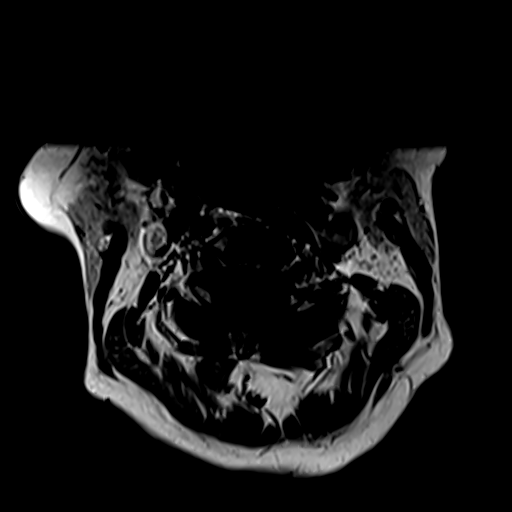

[31 of 48 positions shown; findings below may reference images not displayed]

FINDINGS: Alignment: Straightening of the expected cervical lordosis. 2 mm
C7-T1 grade 1 anterolisthesis.

Vertebrae: Susceptibility artifact arising from ACDF hardware
spanning the C3-C6 levels. Mild degenerative endplate edema at
C6-C7. No appreciable significant marrow edema or focal suspicious
osseous lesion elsewhere.

Cord: No signal abnormality identified within the cervical spinal
cord.

Posterior Fossa, vertebral arteries, paraspinal tissues: No
abnormality identified within included portions of the posterior
fossa. Flow voids preserved within the imaged cervical vertebral
arteries. Paraspinal soft tissues unremarkable.

Disc levels:

Multilevel disc degeneration at the non fused levels, greatest at
C6-C7 and C7-T1 (moderate at these levels).

C2-C3: Shallow disc bulge with endplate spurring. No significant
spinal canal or foraminal stenosis.

C3-C4: Prior ACDF. Facet arthrosis (predominantly on the left). No
significant spinal canal stenosis. Mild left neural foraminal
narrowing.

C4-C5: Prior ACDF. Mild facet arthrosis (predominantly on the left).
No significant spinal canal or foraminal stenosis.

C5-C6: Prior ACDF. Uncovertebral hypertrophy on the right. Mild
facet arthrosis. No significant spinal canal stenosis. Mild/moderate
right neural foraminal narrowing.

C6-C7: Disc bulge with bilateral disc osteophyte ridge/uncinate
hypertrophy. Mild facet arthrosis. Mild partial effacement of the
ventral thecal sac (without spinal cord mass effect).
Moderate/severe bilateral neural foraminal narrowing.

C7-T1: 2 mm grade 1 anterolisthesis. Disc uncovering.
Moderate/advanced facet arthrosis, bilaterally. Mild ligamentum
flavum hypertrophy. No significant spinal canal stenosis.
Mild/moderate right neural foraminal narrowing.
IMPRESSION: Cervical spondylosis and sequela of prior C3-C6 ACDF, as outlined
and with findings most notably as follows.

At C6-C7, there is moderate disc degeneration with trace
degenerative endplate edema. Disc bulge with bilateral disc
osteophyte ridge/uncinate hypertrophy. Mild facet arthrosis.
Moderate/severe bilateral neural foraminal narrowing. No significant
spinal canal stenosis.

At C7-T1, there is 2 mm grade 1 anterolisthesis with disc
uncovering. Moderate disc degeneration. Moderate/advanced facet
arthrosis, bilaterally. Ligamentum flavum hypertrophy. Mild/moderate
right neural foraminal narrowing. No significant spinal canal
stenosis.

No significant spinal canal stenosis at the remaining levels.
Additional sites of foraminal stenosis on the left at C3-C4 (mild)
and on the right at C5-C6 (mild/moderate).

Straightening of the expected cervical lordosis.

## 2021-11-15 DIAGNOSIS — F5101 Primary insomnia: Secondary | ICD-10-CM | POA: Diagnosis not present

## 2021-11-15 DIAGNOSIS — E7800 Pure hypercholesterolemia, unspecified: Secondary | ICD-10-CM | POA: Diagnosis not present

## 2021-11-15 DIAGNOSIS — F419 Anxiety disorder, unspecified: Secondary | ICD-10-CM | POA: Diagnosis not present

## 2021-11-15 DIAGNOSIS — N1831 Chronic kidney disease, stage 3a: Secondary | ICD-10-CM | POA: Diagnosis not present

## 2021-11-15 DIAGNOSIS — K219 Gastro-esophageal reflux disease without esophagitis: Secondary | ICD-10-CM | POA: Diagnosis not present

## 2021-11-15 DIAGNOSIS — E559 Vitamin D deficiency, unspecified: Secondary | ICD-10-CM | POA: Diagnosis not present

## 2021-11-15 DIAGNOSIS — G894 Chronic pain syndrome: Secondary | ICD-10-CM | POA: Diagnosis not present

## 2021-11-15 DIAGNOSIS — R69 Illness, unspecified: Secondary | ICD-10-CM | POA: Diagnosis not present

## 2021-11-15 DIAGNOSIS — R609 Edema, unspecified: Secondary | ICD-10-CM | POA: Diagnosis not present

## 2021-12-04 DIAGNOSIS — M19071 Primary osteoarthritis, right ankle and foot: Secondary | ICD-10-CM | POA: Diagnosis not present

## 2021-12-07 DIAGNOSIS — M5432 Sciatica, left side: Secondary | ICD-10-CM | POA: Diagnosis not present

## 2021-12-07 DIAGNOSIS — M25512 Pain in left shoulder: Secondary | ICD-10-CM | POA: Diagnosis not present

## 2021-12-07 DIAGNOSIS — M4712 Other spondylosis with myelopathy, cervical region: Secondary | ICD-10-CM | POA: Diagnosis not present

## 2021-12-07 DIAGNOSIS — G894 Chronic pain syndrome: Secondary | ICD-10-CM | POA: Diagnosis not present

## 2021-12-07 DIAGNOSIS — Z79891 Long term (current) use of opiate analgesic: Secondary | ICD-10-CM | POA: Diagnosis not present

## 2021-12-07 DIAGNOSIS — M47816 Spondylosis without myelopathy or radiculopathy, lumbar region: Secondary | ICD-10-CM | POA: Diagnosis not present

## 2022-01-07 DIAGNOSIS — Z79899 Other long term (current) drug therapy: Secondary | ICD-10-CM | POA: Diagnosis not present

## 2022-01-07 DIAGNOSIS — Z9181 History of falling: Secondary | ICD-10-CM | POA: Diagnosis not present

## 2022-01-07 DIAGNOSIS — S82142A Displaced bicondylar fracture of left tibia, initial encounter for closed fracture: Secondary | ICD-10-CM | POA: Diagnosis not present

## 2022-01-07 DIAGNOSIS — W010XXA Fall on same level from slipping, tripping and stumbling without subsequent striking against object, initial encounter: Secondary | ICD-10-CM | POA: Diagnosis not present

## 2022-01-07 DIAGNOSIS — S82102A Unspecified fracture of upper end of left tibia, initial encounter for closed fracture: Secondary | ICD-10-CM | POA: Diagnosis not present

## 2022-01-07 DIAGNOSIS — M25551 Pain in right hip: Secondary | ICD-10-CM | POA: Diagnosis not present

## 2022-01-07 DIAGNOSIS — Z043 Encounter for examination and observation following other accident: Secondary | ICD-10-CM | POA: Diagnosis not present

## 2022-01-07 DIAGNOSIS — Z23 Encounter for immunization: Secondary | ICD-10-CM | POA: Diagnosis not present

## 2022-01-07 DIAGNOSIS — Z743 Need for continuous supervision: Secondary | ICD-10-CM | POA: Diagnosis not present

## 2022-01-07 DIAGNOSIS — Z6837 Body mass index (BMI) 37.0-37.9, adult: Secondary | ICD-10-CM | POA: Diagnosis not present

## 2022-01-07 DIAGNOSIS — F32A Depression, unspecified: Secondary | ICD-10-CM | POA: Diagnosis not present

## 2022-01-07 DIAGNOSIS — I959 Hypotension, unspecified: Secondary | ICD-10-CM | POA: Diagnosis not present

## 2022-01-07 DIAGNOSIS — S8992XA Unspecified injury of left lower leg, initial encounter: Secondary | ICD-10-CM | POA: Diagnosis not present

## 2022-01-07 DIAGNOSIS — R609 Edema, unspecified: Secondary | ICD-10-CM | POA: Diagnosis not present

## 2022-01-07 DIAGNOSIS — R69 Illness, unspecified: Secondary | ICD-10-CM | POA: Diagnosis not present

## 2022-01-07 DIAGNOSIS — M25571 Pain in right ankle and joints of right foot: Secondary | ICD-10-CM | POA: Diagnosis not present

## 2022-01-07 DIAGNOSIS — I10 Essential (primary) hypertension: Secondary | ICD-10-CM | POA: Diagnosis not present

## 2022-01-07 DIAGNOSIS — M791 Myalgia, unspecified site: Secondary | ICD-10-CM | POA: Diagnosis not present

## 2022-01-07 DIAGNOSIS — M25552 Pain in left hip: Secondary | ICD-10-CM | POA: Diagnosis not present

## 2022-01-07 DIAGNOSIS — S76312A Strain of muscle, fascia and tendon of the posterior muscle group at thigh level, left thigh, initial encounter: Secondary | ICD-10-CM | POA: Diagnosis not present

## 2022-01-07 DIAGNOSIS — S0990XA Unspecified injury of head, initial encounter: Secondary | ICD-10-CM | POA: Diagnosis not present

## 2022-01-07 DIAGNOSIS — M542 Cervicalgia: Secondary | ICD-10-CM | POA: Diagnosis not present

## 2022-01-07 DIAGNOSIS — R52 Pain, unspecified: Secondary | ICD-10-CM | POA: Diagnosis not present

## 2022-01-07 DIAGNOSIS — E6609 Other obesity due to excess calories: Secondary | ICD-10-CM | POA: Diagnosis not present

## 2022-01-07 DIAGNOSIS — S83512A Sprain of anterior cruciate ligament of left knee, initial encounter: Secondary | ICD-10-CM | POA: Diagnosis not present

## 2022-01-07 DIAGNOSIS — R509 Fever, unspecified: Secondary | ICD-10-CM | POA: Diagnosis not present

## 2022-01-07 DIAGNOSIS — S82122A Displaced fracture of lateral condyle of left tibia, initial encounter for closed fracture: Secondary | ICD-10-CM | POA: Diagnosis not present

## 2022-01-07 DIAGNOSIS — E785 Hyperlipidemia, unspecified: Secondary | ICD-10-CM | POA: Diagnosis not present

## 2022-01-08 DIAGNOSIS — S82145A Nondisplaced bicondylar fracture of left tibia, initial encounter for closed fracture: Secondary | ICD-10-CM | POA: Diagnosis not present

## 2022-01-08 DIAGNOSIS — R69 Illness, unspecified: Secondary | ICD-10-CM | POA: Diagnosis not present

## 2022-01-08 DIAGNOSIS — R509 Fever, unspecified: Secondary | ICD-10-CM | POA: Diagnosis not present

## 2022-01-08 DIAGNOSIS — I959 Hypotension, unspecified: Secondary | ICD-10-CM | POA: Diagnosis not present

## 2022-01-08 DIAGNOSIS — E782 Mixed hyperlipidemia: Secondary | ICD-10-CM | POA: Diagnosis not present

## 2022-01-09 DIAGNOSIS — Z6837 Body mass index (BMI) 37.0-37.9, adult: Secondary | ICD-10-CM | POA: Diagnosis not present

## 2022-01-09 DIAGNOSIS — E782 Mixed hyperlipidemia: Secondary | ICD-10-CM | POA: Diagnosis not present

## 2022-01-09 DIAGNOSIS — E6609 Other obesity due to excess calories: Secondary | ICD-10-CM | POA: Diagnosis not present

## 2022-01-09 DIAGNOSIS — S83512A Sprain of anterior cruciate ligament of left knee, initial encounter: Secondary | ICD-10-CM | POA: Diagnosis not present

## 2022-01-09 DIAGNOSIS — R69 Illness, unspecified: Secondary | ICD-10-CM | POA: Diagnosis not present

## 2022-01-09 DIAGNOSIS — S82145A Nondisplaced bicondylar fracture of left tibia, initial encounter for closed fracture: Secondary | ICD-10-CM | POA: Diagnosis not present

## 2022-01-09 DIAGNOSIS — S82102A Unspecified fracture of upper end of left tibia, initial encounter for closed fracture: Secondary | ICD-10-CM | POA: Diagnosis not present

## 2022-01-10 DIAGNOSIS — E6609 Other obesity due to excess calories: Secondary | ICD-10-CM | POA: Diagnosis not present

## 2022-01-10 DIAGNOSIS — R69 Illness, unspecified: Secondary | ICD-10-CM | POA: Diagnosis not present

## 2022-01-10 DIAGNOSIS — Z6837 Body mass index (BMI) 37.0-37.9, adult: Secondary | ICD-10-CM | POA: Diagnosis not present

## 2022-01-10 DIAGNOSIS — S82145A Nondisplaced bicondylar fracture of left tibia, initial encounter for closed fracture: Secondary | ICD-10-CM | POA: Diagnosis not present

## 2022-01-10 DIAGNOSIS — E782 Mixed hyperlipidemia: Secondary | ICD-10-CM | POA: Diagnosis not present

## 2022-01-16 DIAGNOSIS — S82202D Unspecified fracture of shaft of left tibia, subsequent encounter for closed fracture with routine healing: Secondary | ICD-10-CM | POA: Diagnosis not present

## 2022-01-16 DIAGNOSIS — Z79899 Other long term (current) drug therapy: Secondary | ICD-10-CM | POA: Diagnosis not present

## 2022-01-16 DIAGNOSIS — F329 Major depressive disorder, single episode, unspecified: Secondary | ICD-10-CM | POA: Diagnosis not present

## 2022-01-16 DIAGNOSIS — Z6839 Body mass index (BMI) 39.0-39.9, adult: Secondary | ICD-10-CM | POA: Diagnosis not present

## 2022-01-16 DIAGNOSIS — K5900 Constipation, unspecified: Secondary | ICD-10-CM | POA: Diagnosis not present

## 2022-01-16 DIAGNOSIS — R69 Illness, unspecified: Secondary | ICD-10-CM | POA: Diagnosis not present

## 2022-01-16 DIAGNOSIS — E6609 Other obesity due to excess calories: Secondary | ICD-10-CM | POA: Diagnosis not present

## 2022-01-16 DIAGNOSIS — M898X6 Other specified disorders of bone, lower leg: Secondary | ICD-10-CM | POA: Diagnosis not present

## 2022-01-16 DIAGNOSIS — I952 Hypotension due to drugs: Secondary | ICD-10-CM | POA: Diagnosis not present

## 2022-01-16 DIAGNOSIS — S86119D Strain of other muscle(s) and tendon(s) of posterior muscle group at lower leg level, unspecified leg, subsequent encounter: Secondary | ICD-10-CM | POA: Diagnosis not present

## 2022-01-16 DIAGNOSIS — E782 Mixed hyperlipidemia: Secondary | ICD-10-CM | POA: Diagnosis not present

## 2022-01-16 DIAGNOSIS — S83422D Sprain of lateral collateral ligament of left knee, subsequent encounter: Secondary | ICD-10-CM | POA: Diagnosis not present

## 2022-01-16 DIAGNOSIS — F32A Depression, unspecified: Secondary | ICD-10-CM | POA: Diagnosis not present

## 2022-01-16 DIAGNOSIS — G8929 Other chronic pain: Secondary | ICD-10-CM | POA: Diagnosis not present

## 2022-01-16 DIAGNOSIS — W010XXA Fall on same level from slipping, tripping and stumbling without subsequent striking against object, initial encounter: Secondary | ICD-10-CM | POA: Diagnosis not present

## 2022-01-16 DIAGNOSIS — S83512A Sprain of anterior cruciate ligament of left knee, initial encounter: Secondary | ICD-10-CM | POA: Diagnosis not present

## 2022-01-16 DIAGNOSIS — S83512D Sprain of anterior cruciate ligament of left knee, subsequent encounter: Secondary | ICD-10-CM | POA: Diagnosis not present

## 2022-01-16 DIAGNOSIS — R600 Localized edema: Secondary | ICD-10-CM | POA: Diagnosis not present

## 2022-01-16 DIAGNOSIS — Z6837 Body mass index (BMI) 37.0-37.9, adult: Secondary | ICD-10-CM | POA: Diagnosis not present

## 2022-01-16 DIAGNOSIS — R509 Fever, unspecified: Secondary | ICD-10-CM | POA: Diagnosis not present

## 2022-01-16 DIAGNOSIS — S82143A Displaced bicondylar fracture of unspecified tibia, initial encounter for closed fracture: Secondary | ICD-10-CM | POA: Diagnosis not present

## 2022-01-16 DIAGNOSIS — M545 Low back pain, unspecified: Secondary | ICD-10-CM | POA: Diagnosis not present

## 2022-01-16 DIAGNOSIS — M79662 Pain in left lower leg: Secondary | ICD-10-CM | POA: Diagnosis not present

## 2022-01-16 DIAGNOSIS — S86112D Strain of other muscle(s) and tendon(s) of posterior muscle group at lower leg level, left leg, subsequent encounter: Secondary | ICD-10-CM | POA: Diagnosis not present

## 2022-01-16 DIAGNOSIS — S82143D Displaced bicondylar fracture of unspecified tibia, subsequent encounter for closed fracture with routine healing: Secondary | ICD-10-CM | POA: Diagnosis not present

## 2022-01-16 DIAGNOSIS — M84562A Pathological fracture in neoplastic disease, left tibia, initial encounter for fracture: Secondary | ICD-10-CM | POA: Diagnosis not present

## 2022-01-16 DIAGNOSIS — S82102A Unspecified fracture of upper end of left tibia, initial encounter for closed fracture: Secondary | ICD-10-CM | POA: Diagnosis not present

## 2022-01-16 DIAGNOSIS — R2689 Other abnormalities of gait and mobility: Secondary | ICD-10-CM | POA: Diagnosis not present

## 2022-01-16 DIAGNOSIS — S82202A Unspecified fracture of shaft of left tibia, initial encounter for closed fracture: Secondary | ICD-10-CM | POA: Diagnosis not present

## 2022-01-16 DIAGNOSIS — E785 Hyperlipidemia, unspecified: Secondary | ICD-10-CM | POA: Diagnosis not present

## 2022-01-16 DIAGNOSIS — Z9181 History of falling: Secondary | ICD-10-CM | POA: Diagnosis not present

## 2022-01-16 DIAGNOSIS — Z23 Encounter for immunization: Secondary | ICD-10-CM | POA: Diagnosis not present

## 2022-01-16 DIAGNOSIS — B354 Tinea corporis: Secondary | ICD-10-CM | POA: Diagnosis not present

## 2022-01-16 DIAGNOSIS — S82142D Displaced bicondylar fracture of left tibia, subsequent encounter for closed fracture with routine healing: Secondary | ICD-10-CM | POA: Diagnosis not present

## 2022-01-16 DIAGNOSIS — S82142A Displaced bicondylar fracture of left tibia, initial encounter for closed fracture: Secondary | ICD-10-CM | POA: Diagnosis not present

## 2022-01-16 DIAGNOSIS — S76312A Strain of muscle, fascia and tendon of the posterior muscle group at thigh level, left thigh, initial encounter: Secondary | ICD-10-CM | POA: Diagnosis not present

## 2022-01-16 DIAGNOSIS — Z7409 Other reduced mobility: Secondary | ICD-10-CM | POA: Diagnosis not present

## 2022-01-16 DIAGNOSIS — I959 Hypotension, unspecified: Secondary | ICD-10-CM | POA: Diagnosis not present

## 2022-01-16 DIAGNOSIS — S83412D Sprain of medial collateral ligament of left knee, subsequent encounter: Secondary | ICD-10-CM | POA: Diagnosis not present

## 2022-01-22 DIAGNOSIS — S82142A Displaced bicondylar fracture of left tibia, initial encounter for closed fracture: Secondary | ICD-10-CM | POA: Diagnosis not present

## 2022-01-30 DIAGNOSIS — Z9181 History of falling: Secondary | ICD-10-CM | POA: Diagnosis not present

## 2022-01-30 DIAGNOSIS — G8929 Other chronic pain: Secondary | ICD-10-CM | POA: Diagnosis not present

## 2022-01-30 DIAGNOSIS — Z87440 Personal history of urinary (tract) infections: Secondary | ICD-10-CM | POA: Diagnosis not present

## 2022-01-30 DIAGNOSIS — J40 Bronchitis, not specified as acute or chronic: Secondary | ICD-10-CM | POA: Diagnosis not present

## 2022-01-30 DIAGNOSIS — M519 Unspecified thoracic, thoracolumbar and lumbosacral intervertebral disc disorder: Secondary | ICD-10-CM | POA: Diagnosis not present

## 2022-01-30 DIAGNOSIS — I10 Essential (primary) hypertension: Secondary | ICD-10-CM | POA: Diagnosis not present

## 2022-01-30 DIAGNOSIS — E785 Hyperlipidemia, unspecified: Secondary | ICD-10-CM | POA: Diagnosis not present

## 2022-01-30 DIAGNOSIS — J302 Other seasonal allergic rhinitis: Secondary | ICD-10-CM | POA: Diagnosis not present

## 2022-01-30 DIAGNOSIS — M4800 Spinal stenosis, site unspecified: Secondary | ICD-10-CM | POA: Diagnosis not present

## 2022-01-30 DIAGNOSIS — I959 Hypotension, unspecified: Secondary | ICD-10-CM | POA: Diagnosis not present

## 2022-01-30 DIAGNOSIS — S82142D Displaced bicondylar fracture of left tibia, subsequent encounter for closed fracture with routine healing: Secondary | ICD-10-CM | POA: Diagnosis not present

## 2022-01-30 DIAGNOSIS — J45909 Unspecified asthma, uncomplicated: Secondary | ICD-10-CM | POA: Diagnosis not present

## 2022-01-30 DIAGNOSIS — S83512D Sprain of anterior cruciate ligament of left knee, subsequent encounter: Secondary | ICD-10-CM | POA: Diagnosis not present

## 2022-01-30 DIAGNOSIS — S86111D Strain of other muscle(s) and tendon(s) of posterior muscle group at lower leg level, right leg, subsequent encounter: Secondary | ICD-10-CM | POA: Diagnosis not present

## 2022-01-30 DIAGNOSIS — S83412D Sprain of medial collateral ligament of left knee, subsequent encounter: Secondary | ICD-10-CM | POA: Diagnosis not present

## 2022-01-30 DIAGNOSIS — R69 Illness, unspecified: Secondary | ICD-10-CM | POA: Diagnosis not present

## 2022-01-30 DIAGNOSIS — M5430 Sciatica, unspecified side: Secondary | ICD-10-CM | POA: Diagnosis not present

## 2022-01-30 DIAGNOSIS — Z4789 Encounter for other orthopedic aftercare: Secondary | ICD-10-CM | POA: Diagnosis not present

## 2022-01-30 DIAGNOSIS — K219 Gastro-esophageal reflux disease without esophagitis: Secondary | ICD-10-CM | POA: Diagnosis not present

## 2022-01-30 DIAGNOSIS — S83422D Sprain of lateral collateral ligament of left knee, subsequent encounter: Secondary | ICD-10-CM | POA: Diagnosis not present

## 2022-01-31 DIAGNOSIS — S83422D Sprain of lateral collateral ligament of left knee, subsequent encounter: Secondary | ICD-10-CM | POA: Diagnosis not present

## 2022-01-31 DIAGNOSIS — S83512D Sprain of anterior cruciate ligament of left knee, subsequent encounter: Secondary | ICD-10-CM | POA: Diagnosis not present

## 2022-01-31 DIAGNOSIS — R262 Difficulty in walking, not elsewhere classified: Secondary | ICD-10-CM | POA: Diagnosis not present

## 2022-01-31 DIAGNOSIS — M4800 Spinal stenosis, site unspecified: Secondary | ICD-10-CM | POA: Diagnosis not present

## 2022-01-31 DIAGNOSIS — S83412D Sprain of medial collateral ligament of left knee, subsequent encounter: Secondary | ICD-10-CM | POA: Diagnosis not present

## 2022-01-31 DIAGNOSIS — J40 Bronchitis, not specified as acute or chronic: Secondary | ICD-10-CM | POA: Diagnosis not present

## 2022-01-31 DIAGNOSIS — E785 Hyperlipidemia, unspecified: Secondary | ICD-10-CM | POA: Diagnosis not present

## 2022-01-31 DIAGNOSIS — I10 Essential (primary) hypertension: Secondary | ICD-10-CM | POA: Diagnosis not present

## 2022-01-31 DIAGNOSIS — K219 Gastro-esophageal reflux disease without esophagitis: Secondary | ICD-10-CM | POA: Diagnosis not present

## 2022-01-31 DIAGNOSIS — M519 Unspecified thoracic, thoracolumbar and lumbosacral intervertebral disc disorder: Secondary | ICD-10-CM | POA: Diagnosis not present

## 2022-01-31 DIAGNOSIS — R69 Illness, unspecified: Secondary | ICD-10-CM | POA: Diagnosis not present

## 2022-01-31 DIAGNOSIS — J45909 Unspecified asthma, uncomplicated: Secondary | ICD-10-CM | POA: Diagnosis not present

## 2022-01-31 DIAGNOSIS — Z87440 Personal history of urinary (tract) infections: Secondary | ICD-10-CM | POA: Diagnosis not present

## 2022-01-31 DIAGNOSIS — S82142A Displaced bicondylar fracture of left tibia, initial encounter for closed fracture: Secondary | ICD-10-CM | POA: Diagnosis not present

## 2022-01-31 DIAGNOSIS — R509 Fever, unspecified: Secondary | ICD-10-CM | POA: Diagnosis not present

## 2022-01-31 DIAGNOSIS — Z9181 History of falling: Secondary | ICD-10-CM | POA: Diagnosis not present

## 2022-01-31 DIAGNOSIS — I959 Hypotension, unspecified: Secondary | ICD-10-CM | POA: Diagnosis not present

## 2022-01-31 DIAGNOSIS — J302 Other seasonal allergic rhinitis: Secondary | ICD-10-CM | POA: Diagnosis not present

## 2022-01-31 DIAGNOSIS — S86111D Strain of other muscle(s) and tendon(s) of posterior muscle group at lower leg level, right leg, subsequent encounter: Secondary | ICD-10-CM | POA: Diagnosis not present

## 2022-01-31 DIAGNOSIS — M5430 Sciatica, unspecified side: Secondary | ICD-10-CM | POA: Diagnosis not present

## 2022-01-31 DIAGNOSIS — S82142D Displaced bicondylar fracture of left tibia, subsequent encounter for closed fracture with routine healing: Secondary | ICD-10-CM | POA: Diagnosis not present

## 2022-01-31 DIAGNOSIS — G8929 Other chronic pain: Secondary | ICD-10-CM | POA: Diagnosis not present

## 2022-02-06 DIAGNOSIS — S86111D Strain of other muscle(s) and tendon(s) of posterior muscle group at lower leg level, right leg, subsequent encounter: Secondary | ICD-10-CM | POA: Diagnosis not present

## 2022-02-06 DIAGNOSIS — E785 Hyperlipidemia, unspecified: Secondary | ICD-10-CM | POA: Diagnosis not present

## 2022-02-06 DIAGNOSIS — I10 Essential (primary) hypertension: Secondary | ICD-10-CM | POA: Diagnosis not present

## 2022-02-06 DIAGNOSIS — M4800 Spinal stenosis, site unspecified: Secondary | ICD-10-CM | POA: Diagnosis not present

## 2022-02-06 DIAGNOSIS — S83512D Sprain of anterior cruciate ligament of left knee, subsequent encounter: Secondary | ICD-10-CM | POA: Diagnosis not present

## 2022-02-06 DIAGNOSIS — R69 Illness, unspecified: Secondary | ICD-10-CM | POA: Diagnosis not present

## 2022-02-06 DIAGNOSIS — J40 Bronchitis, not specified as acute or chronic: Secondary | ICD-10-CM | POA: Diagnosis not present

## 2022-02-06 DIAGNOSIS — Z87440 Personal history of urinary (tract) infections: Secondary | ICD-10-CM | POA: Diagnosis not present

## 2022-02-06 DIAGNOSIS — K219 Gastro-esophageal reflux disease without esophagitis: Secondary | ICD-10-CM | POA: Diagnosis not present

## 2022-02-06 DIAGNOSIS — M5430 Sciatica, unspecified side: Secondary | ICD-10-CM | POA: Diagnosis not present

## 2022-02-06 DIAGNOSIS — M519 Unspecified thoracic, thoracolumbar and lumbosacral intervertebral disc disorder: Secondary | ICD-10-CM | POA: Diagnosis not present

## 2022-02-06 DIAGNOSIS — J45909 Unspecified asthma, uncomplicated: Secondary | ICD-10-CM | POA: Diagnosis not present

## 2022-02-06 DIAGNOSIS — G8929 Other chronic pain: Secondary | ICD-10-CM | POA: Diagnosis not present

## 2022-02-06 DIAGNOSIS — Z9181 History of falling: Secondary | ICD-10-CM | POA: Diagnosis not present

## 2022-02-06 DIAGNOSIS — J302 Other seasonal allergic rhinitis: Secondary | ICD-10-CM | POA: Diagnosis not present

## 2022-02-06 DIAGNOSIS — S82142D Displaced bicondylar fracture of left tibia, subsequent encounter for closed fracture with routine healing: Secondary | ICD-10-CM | POA: Diagnosis not present

## 2022-02-06 DIAGNOSIS — S83422D Sprain of lateral collateral ligament of left knee, subsequent encounter: Secondary | ICD-10-CM | POA: Diagnosis not present

## 2022-02-06 DIAGNOSIS — S83412D Sprain of medial collateral ligament of left knee, subsequent encounter: Secondary | ICD-10-CM | POA: Diagnosis not present

## 2022-02-06 DIAGNOSIS — I959 Hypotension, unspecified: Secondary | ICD-10-CM | POA: Diagnosis not present

## 2022-02-09 DIAGNOSIS — M4800 Spinal stenosis, site unspecified: Secondary | ICD-10-CM | POA: Diagnosis not present

## 2022-02-09 DIAGNOSIS — Z87440 Personal history of urinary (tract) infections: Secondary | ICD-10-CM | POA: Diagnosis not present

## 2022-02-09 DIAGNOSIS — J302 Other seasonal allergic rhinitis: Secondary | ICD-10-CM | POA: Diagnosis not present

## 2022-02-09 DIAGNOSIS — I10 Essential (primary) hypertension: Secondary | ICD-10-CM | POA: Diagnosis not present

## 2022-02-09 DIAGNOSIS — M5430 Sciatica, unspecified side: Secondary | ICD-10-CM | POA: Diagnosis not present

## 2022-02-09 DIAGNOSIS — S86111D Strain of other muscle(s) and tendon(s) of posterior muscle group at lower leg level, right leg, subsequent encounter: Secondary | ICD-10-CM | POA: Diagnosis not present

## 2022-02-09 DIAGNOSIS — Z9181 History of falling: Secondary | ICD-10-CM | POA: Diagnosis not present

## 2022-02-09 DIAGNOSIS — R69 Illness, unspecified: Secondary | ICD-10-CM | POA: Diagnosis not present

## 2022-02-09 DIAGNOSIS — S83422D Sprain of lateral collateral ligament of left knee, subsequent encounter: Secondary | ICD-10-CM | POA: Diagnosis not present

## 2022-02-09 DIAGNOSIS — E785 Hyperlipidemia, unspecified: Secondary | ICD-10-CM | POA: Diagnosis not present

## 2022-02-09 DIAGNOSIS — S83412D Sprain of medial collateral ligament of left knee, subsequent encounter: Secondary | ICD-10-CM | POA: Diagnosis not present

## 2022-02-09 DIAGNOSIS — S82142D Displaced bicondylar fracture of left tibia, subsequent encounter for closed fracture with routine healing: Secondary | ICD-10-CM | POA: Diagnosis not present

## 2022-02-09 DIAGNOSIS — S83512D Sprain of anterior cruciate ligament of left knee, subsequent encounter: Secondary | ICD-10-CM | POA: Diagnosis not present

## 2022-02-09 DIAGNOSIS — J45909 Unspecified asthma, uncomplicated: Secondary | ICD-10-CM | POA: Diagnosis not present

## 2022-02-09 DIAGNOSIS — M519 Unspecified thoracic, thoracolumbar and lumbosacral intervertebral disc disorder: Secondary | ICD-10-CM | POA: Diagnosis not present

## 2022-02-09 DIAGNOSIS — G8929 Other chronic pain: Secondary | ICD-10-CM | POA: Diagnosis not present

## 2022-02-09 DIAGNOSIS — K219 Gastro-esophageal reflux disease without esophagitis: Secondary | ICD-10-CM | POA: Diagnosis not present

## 2022-02-09 DIAGNOSIS — I959 Hypotension, unspecified: Secondary | ICD-10-CM | POA: Diagnosis not present

## 2022-02-09 DIAGNOSIS — J40 Bronchitis, not specified as acute or chronic: Secondary | ICD-10-CM | POA: Diagnosis not present

## 2022-02-12 DIAGNOSIS — Z9181 History of falling: Secondary | ICD-10-CM | POA: Diagnosis not present

## 2022-02-12 DIAGNOSIS — M5430 Sciatica, unspecified side: Secondary | ICD-10-CM | POA: Diagnosis not present

## 2022-02-12 DIAGNOSIS — S82142D Displaced bicondylar fracture of left tibia, subsequent encounter for closed fracture with routine healing: Secondary | ICD-10-CM | POA: Diagnosis not present

## 2022-02-12 DIAGNOSIS — Z87440 Personal history of urinary (tract) infections: Secondary | ICD-10-CM | POA: Diagnosis not present

## 2022-02-12 DIAGNOSIS — J302 Other seasonal allergic rhinitis: Secondary | ICD-10-CM | POA: Diagnosis not present

## 2022-02-12 DIAGNOSIS — M519 Unspecified thoracic, thoracolumbar and lumbosacral intervertebral disc disorder: Secondary | ICD-10-CM | POA: Diagnosis not present

## 2022-02-12 DIAGNOSIS — S83512D Sprain of anterior cruciate ligament of left knee, subsequent encounter: Secondary | ICD-10-CM | POA: Diagnosis not present

## 2022-02-12 DIAGNOSIS — G8929 Other chronic pain: Secondary | ICD-10-CM | POA: Diagnosis not present

## 2022-02-12 DIAGNOSIS — R69 Illness, unspecified: Secondary | ICD-10-CM | POA: Diagnosis not present

## 2022-02-12 DIAGNOSIS — K219 Gastro-esophageal reflux disease without esophagitis: Secondary | ICD-10-CM | POA: Diagnosis not present

## 2022-02-12 DIAGNOSIS — I10 Essential (primary) hypertension: Secondary | ICD-10-CM | POA: Diagnosis not present

## 2022-02-12 DIAGNOSIS — E785 Hyperlipidemia, unspecified: Secondary | ICD-10-CM | POA: Diagnosis not present

## 2022-02-12 DIAGNOSIS — I959 Hypotension, unspecified: Secondary | ICD-10-CM | POA: Diagnosis not present

## 2022-02-12 DIAGNOSIS — S86111D Strain of other muscle(s) and tendon(s) of posterior muscle group at lower leg level, right leg, subsequent encounter: Secondary | ICD-10-CM | POA: Diagnosis not present

## 2022-02-12 DIAGNOSIS — M4800 Spinal stenosis, site unspecified: Secondary | ICD-10-CM | POA: Diagnosis not present

## 2022-02-12 DIAGNOSIS — S83412D Sprain of medial collateral ligament of left knee, subsequent encounter: Secondary | ICD-10-CM | POA: Diagnosis not present

## 2022-02-12 DIAGNOSIS — J45909 Unspecified asthma, uncomplicated: Secondary | ICD-10-CM | POA: Diagnosis not present

## 2022-02-12 DIAGNOSIS — J40 Bronchitis, not specified as acute or chronic: Secondary | ICD-10-CM | POA: Diagnosis not present

## 2022-02-12 DIAGNOSIS — S83422D Sprain of lateral collateral ligament of left knee, subsequent encounter: Secondary | ICD-10-CM | POA: Diagnosis not present

## 2022-02-15 DIAGNOSIS — Z87440 Personal history of urinary (tract) infections: Secondary | ICD-10-CM | POA: Diagnosis not present

## 2022-02-15 DIAGNOSIS — Z9181 History of falling: Secondary | ICD-10-CM | POA: Diagnosis not present

## 2022-02-15 DIAGNOSIS — I959 Hypotension, unspecified: Secondary | ICD-10-CM | POA: Diagnosis not present

## 2022-02-15 DIAGNOSIS — G8929 Other chronic pain: Secondary | ICD-10-CM | POA: Diagnosis not present

## 2022-02-15 DIAGNOSIS — S83412D Sprain of medial collateral ligament of left knee, subsequent encounter: Secondary | ICD-10-CM | POA: Diagnosis not present

## 2022-02-15 DIAGNOSIS — I10 Essential (primary) hypertension: Secondary | ICD-10-CM | POA: Diagnosis not present

## 2022-02-15 DIAGNOSIS — J302 Other seasonal allergic rhinitis: Secondary | ICD-10-CM | POA: Diagnosis not present

## 2022-02-15 DIAGNOSIS — S83422D Sprain of lateral collateral ligament of left knee, subsequent encounter: Secondary | ICD-10-CM | POA: Diagnosis not present

## 2022-02-15 DIAGNOSIS — K219 Gastro-esophageal reflux disease without esophagitis: Secondary | ICD-10-CM | POA: Diagnosis not present

## 2022-02-15 DIAGNOSIS — S82142D Displaced bicondylar fracture of left tibia, subsequent encounter for closed fracture with routine healing: Secondary | ICD-10-CM | POA: Diagnosis not present

## 2022-02-15 DIAGNOSIS — R69 Illness, unspecified: Secondary | ICD-10-CM | POA: Diagnosis not present

## 2022-02-15 DIAGNOSIS — S83512D Sprain of anterior cruciate ligament of left knee, subsequent encounter: Secondary | ICD-10-CM | POA: Diagnosis not present

## 2022-02-15 DIAGNOSIS — M519 Unspecified thoracic, thoracolumbar and lumbosacral intervertebral disc disorder: Secondary | ICD-10-CM | POA: Diagnosis not present

## 2022-02-15 DIAGNOSIS — M4800 Spinal stenosis, site unspecified: Secondary | ICD-10-CM | POA: Diagnosis not present

## 2022-02-15 DIAGNOSIS — E785 Hyperlipidemia, unspecified: Secondary | ICD-10-CM | POA: Diagnosis not present

## 2022-02-15 DIAGNOSIS — M5430 Sciatica, unspecified side: Secondary | ICD-10-CM | POA: Diagnosis not present

## 2022-02-15 DIAGNOSIS — J45909 Unspecified asthma, uncomplicated: Secondary | ICD-10-CM | POA: Diagnosis not present

## 2022-02-15 DIAGNOSIS — J40 Bronchitis, not specified as acute or chronic: Secondary | ICD-10-CM | POA: Diagnosis not present

## 2022-02-15 DIAGNOSIS — S86111D Strain of other muscle(s) and tendon(s) of posterior muscle group at lower leg level, right leg, subsequent encounter: Secondary | ICD-10-CM | POA: Diagnosis not present

## 2022-02-19 DIAGNOSIS — S83512D Sprain of anterior cruciate ligament of left knee, subsequent encounter: Secondary | ICD-10-CM | POA: Diagnosis not present

## 2022-02-19 DIAGNOSIS — K219 Gastro-esophageal reflux disease without esophagitis: Secondary | ICD-10-CM | POA: Diagnosis not present

## 2022-02-19 DIAGNOSIS — Z9181 History of falling: Secondary | ICD-10-CM | POA: Diagnosis not present

## 2022-02-19 DIAGNOSIS — J45909 Unspecified asthma, uncomplicated: Secondary | ICD-10-CM | POA: Diagnosis not present

## 2022-02-19 DIAGNOSIS — I10 Essential (primary) hypertension: Secondary | ICD-10-CM | POA: Diagnosis not present

## 2022-02-19 DIAGNOSIS — M5430 Sciatica, unspecified side: Secondary | ICD-10-CM | POA: Diagnosis not present

## 2022-02-19 DIAGNOSIS — E785 Hyperlipidemia, unspecified: Secondary | ICD-10-CM | POA: Diagnosis not present

## 2022-02-19 DIAGNOSIS — M519 Unspecified thoracic, thoracolumbar and lumbosacral intervertebral disc disorder: Secondary | ICD-10-CM | POA: Diagnosis not present

## 2022-02-19 DIAGNOSIS — Z87440 Personal history of urinary (tract) infections: Secondary | ICD-10-CM | POA: Diagnosis not present

## 2022-02-19 DIAGNOSIS — J302 Other seasonal allergic rhinitis: Secondary | ICD-10-CM | POA: Diagnosis not present

## 2022-02-19 DIAGNOSIS — I959 Hypotension, unspecified: Secondary | ICD-10-CM | POA: Diagnosis not present

## 2022-02-19 DIAGNOSIS — G8929 Other chronic pain: Secondary | ICD-10-CM | POA: Diagnosis not present

## 2022-02-19 DIAGNOSIS — R69 Illness, unspecified: Secondary | ICD-10-CM | POA: Diagnosis not present

## 2022-02-19 DIAGNOSIS — J40 Bronchitis, not specified as acute or chronic: Secondary | ICD-10-CM | POA: Diagnosis not present

## 2022-02-19 DIAGNOSIS — S82142D Displaced bicondylar fracture of left tibia, subsequent encounter for closed fracture with routine healing: Secondary | ICD-10-CM | POA: Diagnosis not present

## 2022-02-19 DIAGNOSIS — S83412D Sprain of medial collateral ligament of left knee, subsequent encounter: Secondary | ICD-10-CM | POA: Diagnosis not present

## 2022-02-19 DIAGNOSIS — S86111D Strain of other muscle(s) and tendon(s) of posterior muscle group at lower leg level, right leg, subsequent encounter: Secondary | ICD-10-CM | POA: Diagnosis not present

## 2022-02-19 DIAGNOSIS — M4800 Spinal stenosis, site unspecified: Secondary | ICD-10-CM | POA: Diagnosis not present

## 2022-02-19 DIAGNOSIS — S83422D Sprain of lateral collateral ligament of left knee, subsequent encounter: Secondary | ICD-10-CM | POA: Diagnosis not present

## 2022-02-22 DIAGNOSIS — M47812 Spondylosis without myelopathy or radiculopathy, cervical region: Secondary | ICD-10-CM | POA: Diagnosis not present

## 2022-02-22 DIAGNOSIS — R030 Elevated blood-pressure reading, without diagnosis of hypertension: Secondary | ICD-10-CM | POA: Diagnosis not present

## 2022-02-22 DIAGNOSIS — Z6837 Body mass index (BMI) 37.0-37.9, adult: Secondary | ICD-10-CM | POA: Diagnosis not present

## 2022-02-22 DIAGNOSIS — M542 Cervicalgia: Secondary | ICD-10-CM | POA: Diagnosis not present

## 2022-02-26 DIAGNOSIS — M25562 Pain in left knee: Secondary | ICD-10-CM | POA: Diagnosis not present

## 2022-02-27 DIAGNOSIS — S83412D Sprain of medial collateral ligament of left knee, subsequent encounter: Secondary | ICD-10-CM | POA: Diagnosis not present

## 2022-02-27 DIAGNOSIS — Z87440 Personal history of urinary (tract) infections: Secondary | ICD-10-CM | POA: Diagnosis not present

## 2022-02-27 DIAGNOSIS — J45909 Unspecified asthma, uncomplicated: Secondary | ICD-10-CM | POA: Diagnosis not present

## 2022-02-27 DIAGNOSIS — S82142D Displaced bicondylar fracture of left tibia, subsequent encounter for closed fracture with routine healing: Secondary | ICD-10-CM | POA: Diagnosis not present

## 2022-02-27 DIAGNOSIS — S83512D Sprain of anterior cruciate ligament of left knee, subsequent encounter: Secondary | ICD-10-CM | POA: Diagnosis not present

## 2022-02-27 DIAGNOSIS — K219 Gastro-esophageal reflux disease without esophagitis: Secondary | ICD-10-CM | POA: Diagnosis not present

## 2022-02-27 DIAGNOSIS — M5430 Sciatica, unspecified side: Secondary | ICD-10-CM | POA: Diagnosis not present

## 2022-02-27 DIAGNOSIS — J40 Bronchitis, not specified as acute or chronic: Secondary | ICD-10-CM | POA: Diagnosis not present

## 2022-02-27 DIAGNOSIS — G8929 Other chronic pain: Secondary | ICD-10-CM | POA: Diagnosis not present

## 2022-02-27 DIAGNOSIS — S83422D Sprain of lateral collateral ligament of left knee, subsequent encounter: Secondary | ICD-10-CM | POA: Diagnosis not present

## 2022-02-27 DIAGNOSIS — I10 Essential (primary) hypertension: Secondary | ICD-10-CM | POA: Diagnosis not present

## 2022-02-27 DIAGNOSIS — S86111D Strain of other muscle(s) and tendon(s) of posterior muscle group at lower leg level, right leg, subsequent encounter: Secondary | ICD-10-CM | POA: Diagnosis not present

## 2022-02-27 DIAGNOSIS — J302 Other seasonal allergic rhinitis: Secondary | ICD-10-CM | POA: Diagnosis not present

## 2022-02-27 DIAGNOSIS — E785 Hyperlipidemia, unspecified: Secondary | ICD-10-CM | POA: Diagnosis not present

## 2022-02-27 DIAGNOSIS — M4800 Spinal stenosis, site unspecified: Secondary | ICD-10-CM | POA: Diagnosis not present

## 2022-02-27 DIAGNOSIS — M519 Unspecified thoracic, thoracolumbar and lumbosacral intervertebral disc disorder: Secondary | ICD-10-CM | POA: Diagnosis not present

## 2022-02-27 DIAGNOSIS — I959 Hypotension, unspecified: Secondary | ICD-10-CM | POA: Diagnosis not present

## 2022-02-27 DIAGNOSIS — Z9181 History of falling: Secondary | ICD-10-CM | POA: Diagnosis not present

## 2022-02-27 DIAGNOSIS — R69 Illness, unspecified: Secondary | ICD-10-CM | POA: Diagnosis not present

## 2022-03-09 DIAGNOSIS — S83422D Sprain of lateral collateral ligament of left knee, subsequent encounter: Secondary | ICD-10-CM | POA: Diagnosis not present

## 2022-03-09 DIAGNOSIS — F329 Major depressive disorder, single episode, unspecified: Secondary | ICD-10-CM | POA: Diagnosis not present

## 2022-03-09 DIAGNOSIS — M4800 Spinal stenosis, site unspecified: Secondary | ICD-10-CM | POA: Diagnosis not present

## 2022-03-09 DIAGNOSIS — K219 Gastro-esophageal reflux disease without esophagitis: Secondary | ICD-10-CM | POA: Diagnosis not present

## 2022-03-09 DIAGNOSIS — S83412D Sprain of medial collateral ligament of left knee, subsequent encounter: Secondary | ICD-10-CM | POA: Diagnosis not present

## 2022-03-09 DIAGNOSIS — J40 Bronchitis, not specified as acute or chronic: Secondary | ICD-10-CM | POA: Diagnosis not present

## 2022-03-09 DIAGNOSIS — I10 Essential (primary) hypertension: Secondary | ICD-10-CM | POA: Diagnosis not present

## 2022-03-09 DIAGNOSIS — I959 Hypotension, unspecified: Secondary | ICD-10-CM | POA: Diagnosis not present

## 2022-03-09 DIAGNOSIS — S83512D Sprain of anterior cruciate ligament of left knee, subsequent encounter: Secondary | ICD-10-CM | POA: Diagnosis not present

## 2022-03-09 DIAGNOSIS — R69 Illness, unspecified: Secondary | ICD-10-CM | POA: Diagnosis not present

## 2022-03-09 DIAGNOSIS — G8929 Other chronic pain: Secondary | ICD-10-CM | POA: Diagnosis not present

## 2022-03-09 DIAGNOSIS — M519 Unspecified thoracic, thoracolumbar and lumbosacral intervertebral disc disorder: Secondary | ICD-10-CM | POA: Diagnosis not present

## 2022-03-09 DIAGNOSIS — S82142D Displaced bicondylar fracture of left tibia, subsequent encounter for closed fracture with routine healing: Secondary | ICD-10-CM | POA: Diagnosis not present

## 2022-03-09 DIAGNOSIS — M5430 Sciatica, unspecified side: Secondary | ICD-10-CM | POA: Diagnosis not present

## 2022-03-09 DIAGNOSIS — S86111D Strain of other muscle(s) and tendon(s) of posterior muscle group at lower leg level, right leg, subsequent encounter: Secondary | ICD-10-CM | POA: Diagnosis not present

## 2022-03-09 DIAGNOSIS — E785 Hyperlipidemia, unspecified: Secondary | ICD-10-CM | POA: Diagnosis not present

## 2022-03-09 DIAGNOSIS — J45909 Unspecified asthma, uncomplicated: Secondary | ICD-10-CM | POA: Diagnosis not present

## 2022-03-09 DIAGNOSIS — J302 Other seasonal allergic rhinitis: Secondary | ICD-10-CM | POA: Diagnosis not present

## 2022-03-09 DIAGNOSIS — Z9181 History of falling: Secondary | ICD-10-CM | POA: Diagnosis not present

## 2022-03-09 DIAGNOSIS — Z87440 Personal history of urinary (tract) infections: Secondary | ICD-10-CM | POA: Diagnosis not present

## 2022-03-09 DIAGNOSIS — F419 Anxiety disorder, unspecified: Secondary | ICD-10-CM | POA: Diagnosis not present

## 2022-03-13 DIAGNOSIS — M25562 Pain in left knee: Secondary | ICD-10-CM | POA: Diagnosis not present

## 2022-03-13 DIAGNOSIS — M25512 Pain in left shoulder: Secondary | ICD-10-CM | POA: Diagnosis not present

## 2022-03-13 DIAGNOSIS — M4712 Other spondylosis with myelopathy, cervical region: Secondary | ICD-10-CM | POA: Diagnosis not present

## 2022-03-13 DIAGNOSIS — M47816 Spondylosis without myelopathy or radiculopathy, lumbar region: Secondary | ICD-10-CM | POA: Diagnosis not present

## 2022-03-14 DIAGNOSIS — M5430 Sciatica, unspecified side: Secondary | ICD-10-CM | POA: Diagnosis not present

## 2022-03-14 DIAGNOSIS — J302 Other seasonal allergic rhinitis: Secondary | ICD-10-CM | POA: Diagnosis not present

## 2022-03-14 DIAGNOSIS — S83512D Sprain of anterior cruciate ligament of left knee, subsequent encounter: Secondary | ICD-10-CM | POA: Diagnosis not present

## 2022-03-14 DIAGNOSIS — S83412D Sprain of medial collateral ligament of left knee, subsequent encounter: Secondary | ICD-10-CM | POA: Diagnosis not present

## 2022-03-14 DIAGNOSIS — F329 Major depressive disorder, single episode, unspecified: Secondary | ICD-10-CM | POA: Diagnosis not present

## 2022-03-14 DIAGNOSIS — K219 Gastro-esophageal reflux disease without esophagitis: Secondary | ICD-10-CM | POA: Diagnosis not present

## 2022-03-14 DIAGNOSIS — S82142D Displaced bicondylar fracture of left tibia, subsequent encounter for closed fracture with routine healing: Secondary | ICD-10-CM | POA: Diagnosis not present

## 2022-03-14 DIAGNOSIS — J45909 Unspecified asthma, uncomplicated: Secondary | ICD-10-CM | POA: Diagnosis not present

## 2022-03-14 DIAGNOSIS — E785 Hyperlipidemia, unspecified: Secondary | ICD-10-CM | POA: Diagnosis not present

## 2022-03-14 DIAGNOSIS — S83422D Sprain of lateral collateral ligament of left knee, subsequent encounter: Secondary | ICD-10-CM | POA: Diagnosis not present

## 2022-03-14 DIAGNOSIS — Z87440 Personal history of urinary (tract) infections: Secondary | ICD-10-CM | POA: Diagnosis not present

## 2022-03-14 DIAGNOSIS — M519 Unspecified thoracic, thoracolumbar and lumbosacral intervertebral disc disorder: Secondary | ICD-10-CM | POA: Diagnosis not present

## 2022-03-14 DIAGNOSIS — R69 Illness, unspecified: Secondary | ICD-10-CM | POA: Diagnosis not present

## 2022-03-14 DIAGNOSIS — Z9181 History of falling: Secondary | ICD-10-CM | POA: Diagnosis not present

## 2022-03-14 DIAGNOSIS — J40 Bronchitis, not specified as acute or chronic: Secondary | ICD-10-CM | POA: Diagnosis not present

## 2022-03-14 DIAGNOSIS — I959 Hypotension, unspecified: Secondary | ICD-10-CM | POA: Diagnosis not present

## 2022-03-14 DIAGNOSIS — S86111D Strain of other muscle(s) and tendon(s) of posterior muscle group at lower leg level, right leg, subsequent encounter: Secondary | ICD-10-CM | POA: Diagnosis not present

## 2022-03-14 DIAGNOSIS — F419 Anxiety disorder, unspecified: Secondary | ICD-10-CM | POA: Diagnosis not present

## 2022-03-14 DIAGNOSIS — I10 Essential (primary) hypertension: Secondary | ICD-10-CM | POA: Diagnosis not present

## 2022-03-14 DIAGNOSIS — G8929 Other chronic pain: Secondary | ICD-10-CM | POA: Diagnosis not present

## 2022-03-14 DIAGNOSIS — M4800 Spinal stenosis, site unspecified: Secondary | ICD-10-CM | POA: Diagnosis not present

## 2022-03-28 DIAGNOSIS — S83412D Sprain of medial collateral ligament of left knee, subsequent encounter: Secondary | ICD-10-CM | POA: Diagnosis not present

## 2022-03-28 DIAGNOSIS — R69 Illness, unspecified: Secondary | ICD-10-CM | POA: Diagnosis not present

## 2022-03-28 DIAGNOSIS — S82142D Displaced bicondylar fracture of left tibia, subsequent encounter for closed fracture with routine healing: Secondary | ICD-10-CM | POA: Diagnosis not present

## 2022-03-28 DIAGNOSIS — J45909 Unspecified asthma, uncomplicated: Secondary | ICD-10-CM | POA: Diagnosis not present

## 2022-03-28 DIAGNOSIS — F419 Anxiety disorder, unspecified: Secondary | ICD-10-CM | POA: Diagnosis not present

## 2022-03-28 DIAGNOSIS — M4800 Spinal stenosis, site unspecified: Secondary | ICD-10-CM | POA: Diagnosis not present

## 2022-03-28 DIAGNOSIS — E785 Hyperlipidemia, unspecified: Secondary | ICD-10-CM | POA: Diagnosis not present

## 2022-03-28 DIAGNOSIS — J302 Other seasonal allergic rhinitis: Secondary | ICD-10-CM | POA: Diagnosis not present

## 2022-03-28 DIAGNOSIS — J40 Bronchitis, not specified as acute or chronic: Secondary | ICD-10-CM | POA: Diagnosis not present

## 2022-03-28 DIAGNOSIS — M5430 Sciatica, unspecified side: Secondary | ICD-10-CM | POA: Diagnosis not present

## 2022-03-28 DIAGNOSIS — Z87440 Personal history of urinary (tract) infections: Secondary | ICD-10-CM | POA: Diagnosis not present

## 2022-03-28 DIAGNOSIS — Z9181 History of falling: Secondary | ICD-10-CM | POA: Diagnosis not present

## 2022-03-28 DIAGNOSIS — S86111D Strain of other muscle(s) and tendon(s) of posterior muscle group at lower leg level, right leg, subsequent encounter: Secondary | ICD-10-CM | POA: Diagnosis not present

## 2022-03-28 DIAGNOSIS — G8929 Other chronic pain: Secondary | ICD-10-CM | POA: Diagnosis not present

## 2022-03-28 DIAGNOSIS — I959 Hypotension, unspecified: Secondary | ICD-10-CM | POA: Diagnosis not present

## 2022-03-28 DIAGNOSIS — M519 Unspecified thoracic, thoracolumbar and lumbosacral intervertebral disc disorder: Secondary | ICD-10-CM | POA: Diagnosis not present

## 2022-03-28 DIAGNOSIS — S83512D Sprain of anterior cruciate ligament of left knee, subsequent encounter: Secondary | ICD-10-CM | POA: Diagnosis not present

## 2022-03-28 DIAGNOSIS — I10 Essential (primary) hypertension: Secondary | ICD-10-CM | POA: Diagnosis not present

## 2022-03-28 DIAGNOSIS — K219 Gastro-esophageal reflux disease without esophagitis: Secondary | ICD-10-CM | POA: Diagnosis not present

## 2022-03-28 DIAGNOSIS — S83422D Sprain of lateral collateral ligament of left knee, subsequent encounter: Secondary | ICD-10-CM | POA: Diagnosis not present

## 2022-03-28 DIAGNOSIS — F329 Major depressive disorder, single episode, unspecified: Secondary | ICD-10-CM | POA: Diagnosis not present

## 2022-03-29 ENCOUNTER — Other Ambulatory Visit: Payer: Self-pay | Admitting: Neurosurgery

## 2022-03-29 DIAGNOSIS — M542 Cervicalgia: Secondary | ICD-10-CM

## 2022-04-17 DIAGNOSIS — B9689 Other specified bacterial agents as the cause of diseases classified elsewhere: Secondary | ICD-10-CM | POA: Diagnosis not present

## 2022-04-17 DIAGNOSIS — J208 Acute bronchitis due to other specified organisms: Secondary | ICD-10-CM | POA: Diagnosis not present

## 2022-04-24 ENCOUNTER — Ambulatory Visit
Admission: RE | Admit: 2022-04-24 | Discharge: 2022-04-24 | Disposition: A | Discharge status: Home / Self Care | Payer: Medicare HMO | Source: Ambulatory Visit | Attending: Neurosurgery | Admitting: Neurosurgery

## 2022-04-24 DIAGNOSIS — M542 Cervicalgia: Secondary | ICD-10-CM

## 2022-04-24 DIAGNOSIS — M50223 Other cervical disc displacement at C6-C7 level: Secondary | ICD-10-CM | POA: Diagnosis not present

## 2022-04-24 DIAGNOSIS — M4803 Spinal stenosis, cervicothoracic region: Secondary | ICD-10-CM | POA: Diagnosis not present

## 2022-04-24 DIAGNOSIS — M4319 Spondylolisthesis, multiple sites in spine: Secondary | ICD-10-CM | POA: Diagnosis not present

## 2022-04-24 DIAGNOSIS — M4802 Spinal stenosis, cervical region: Secondary | ICD-10-CM | POA: Diagnosis not present

## 2022-04-24 IMAGING — MR MR CERVICAL SPINE W/O CM
4 of 5 series · 28 of 48 positions shown · non-contrast
Comparison: None Available.

CLINICAL DATA: Pain radiating from Neck down both arms AND shoulder
X 6 months or more

EXAM:
MRI CERVICAL SPINE WITHOUT CONTRAST
TECHNIQUE: Multiplanar, multisequence MR imaging of the cervical spine was
performed. No intravenous contrast was administered.

[Series 2: T2 · sagittal · 3.0mm · 0.41mm/px · 8 of 17 slices shown (1 of 2)]
[im 1/17]
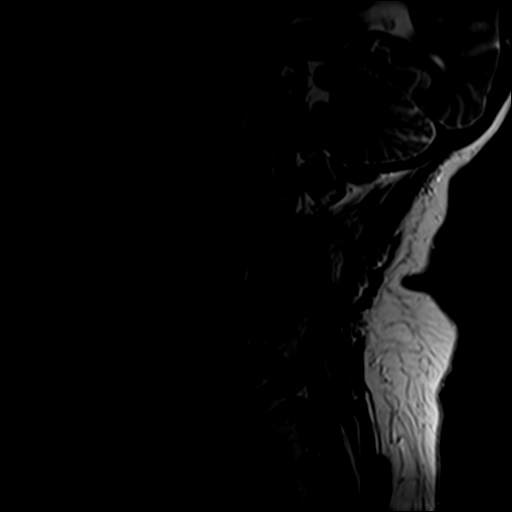
[im 3/17]
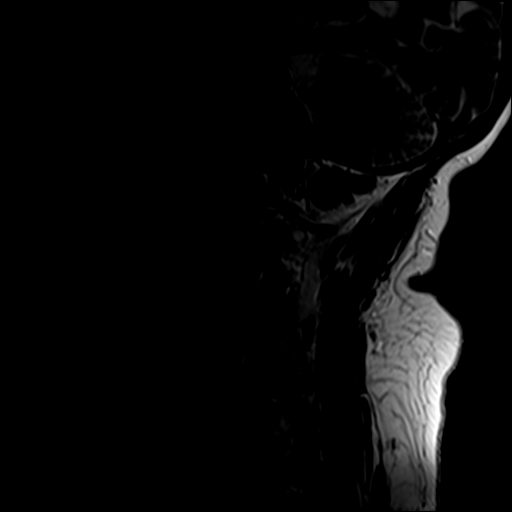
[im 5/17]
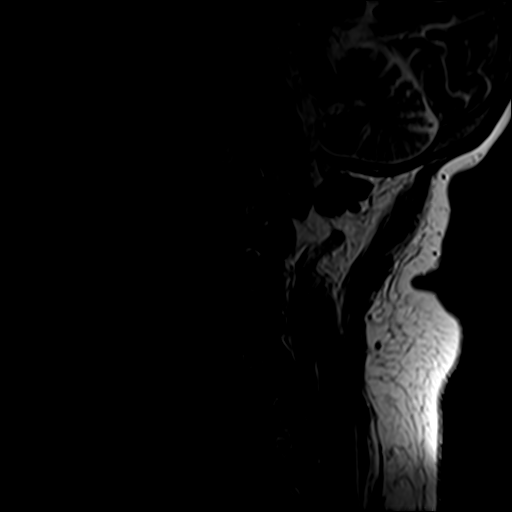
[im 7/17]
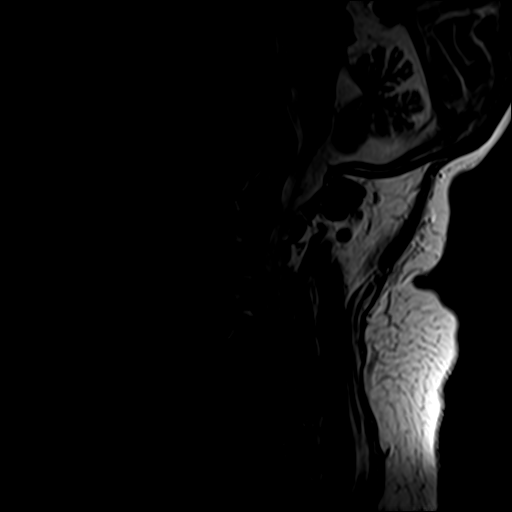
[im 10/17]
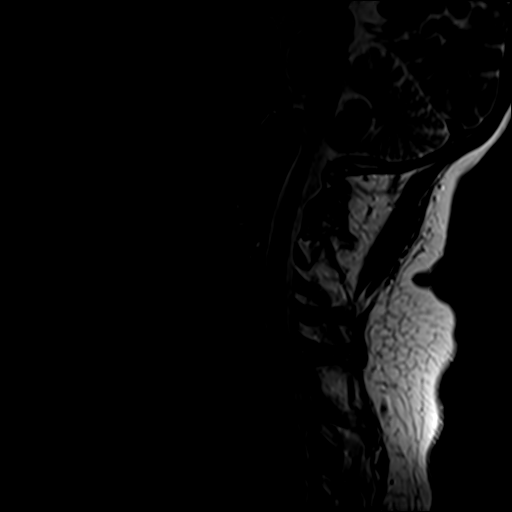
[im 12/17]
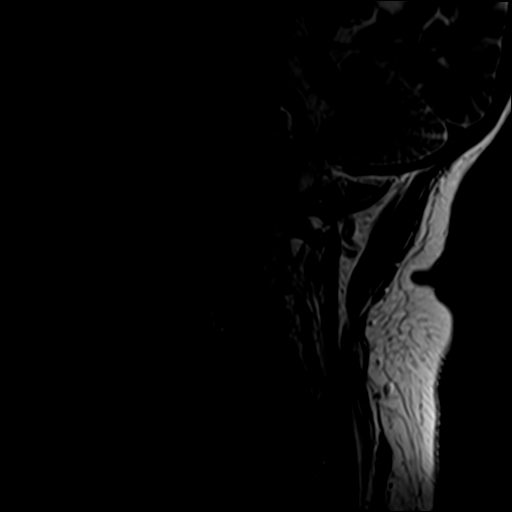
[im 14/17]
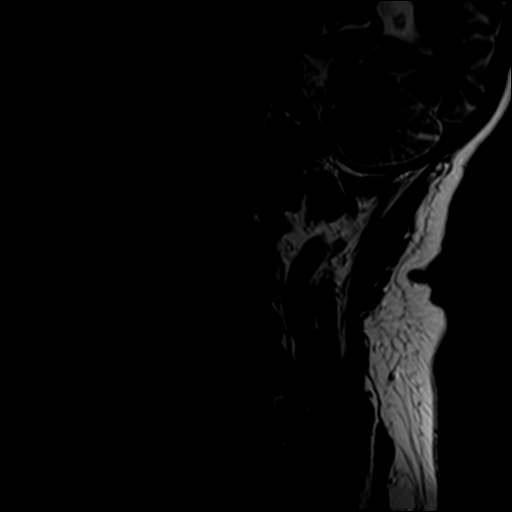
[im 17/17]
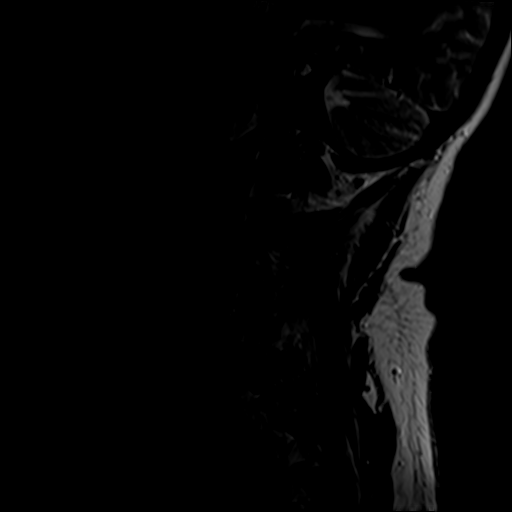

[Series 3: T1 · sagittal · 3.0mm · 0.41mm/px · 8 of 17 slices shown]
[im 1/17]
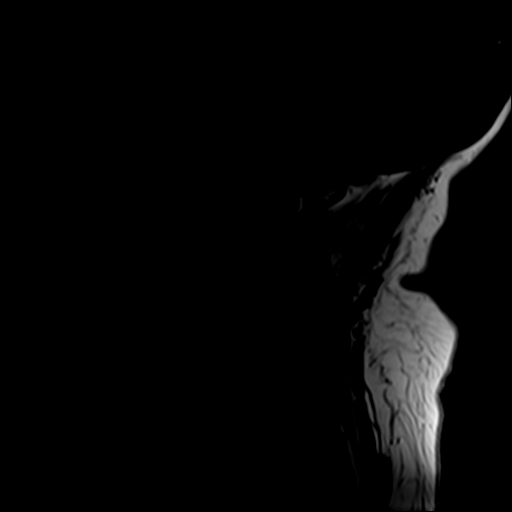
[im 3/17]
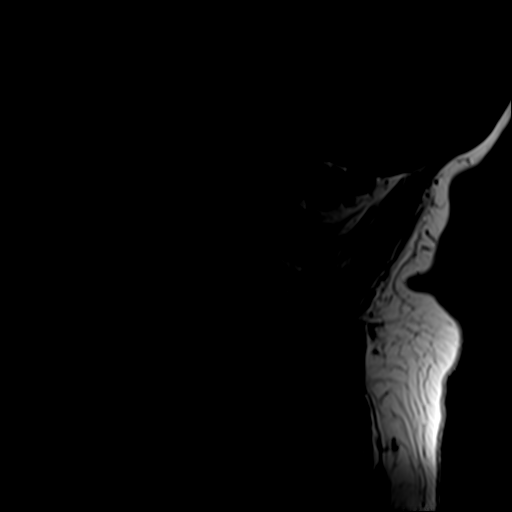
[im 5/17]
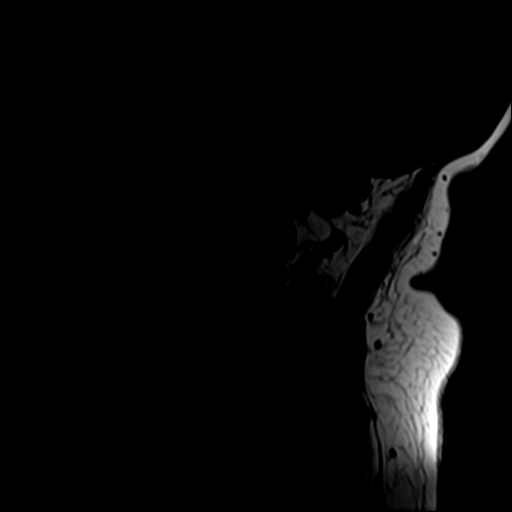
[im 7/17]
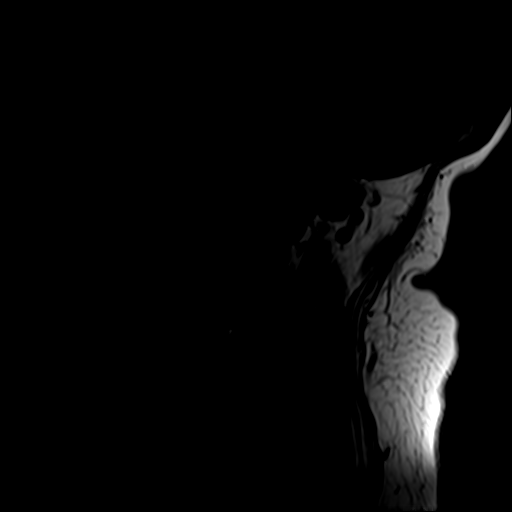
[im 10/17]
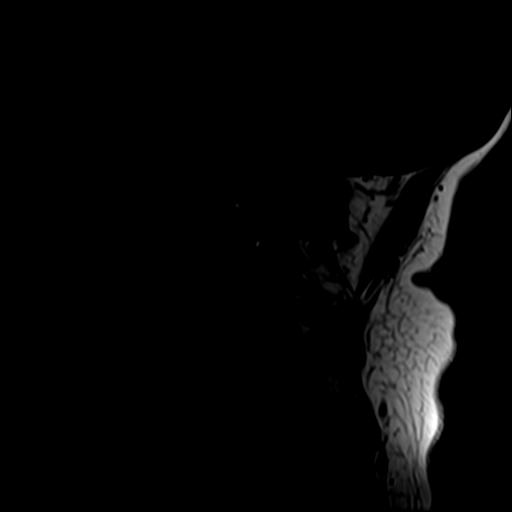
[im 12/17]
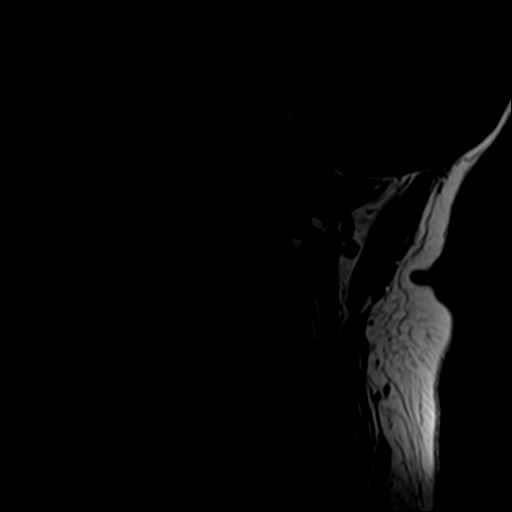
[im 14/17]
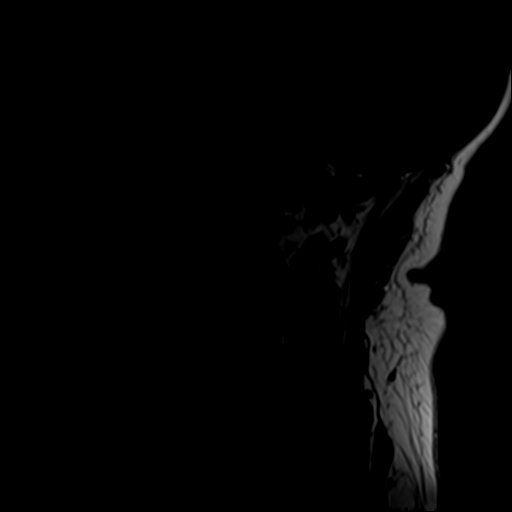
[im 17/17]
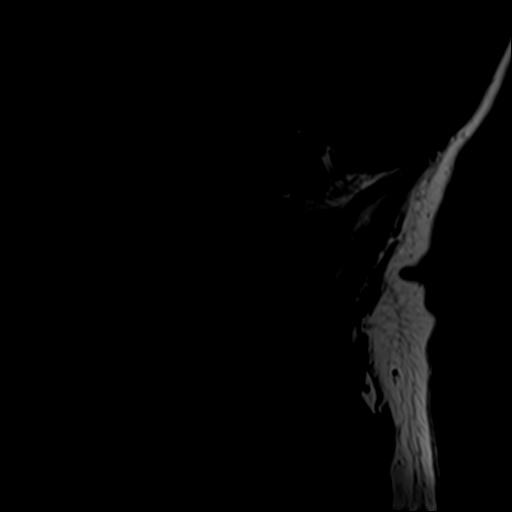

[Series 4: STIR · sagittal · 3.0mm · 0.82mm/px · 3 of 17 slices shown]
[im 3/17]
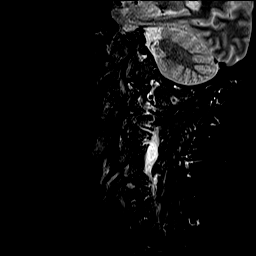
[im 10/17]
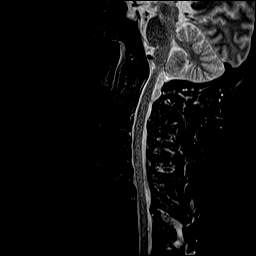
[im 14/17]
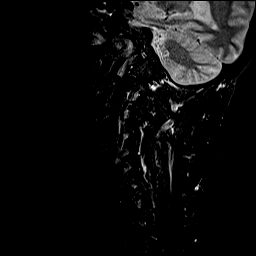

[Series 5: T2 · axial · 3.0mm · 0.70mm/px · z∈[-65,+27]mm · 9 of 26 slices shown (2 of 2)]
[im 1/26]
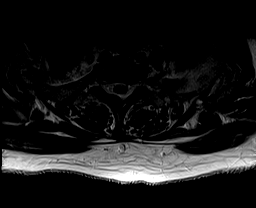
[im 5/26]
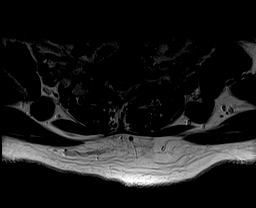
[im 7/26]
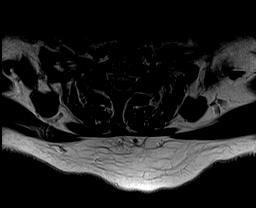
[im 12/26]
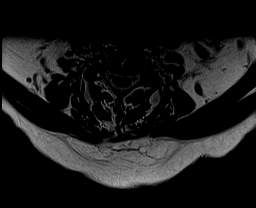
[im 14/26]
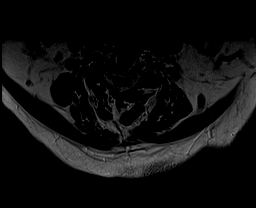
[im 19/26]
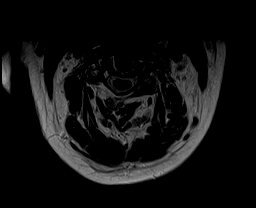
[im 21/26]
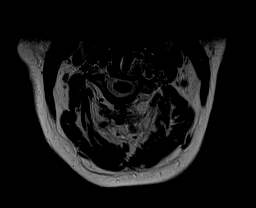
[im 23/26]
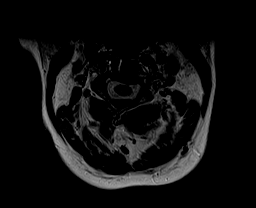
[im 26/26]
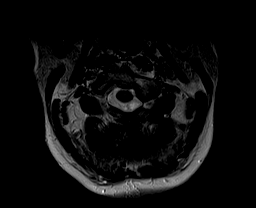

[28 of 48 positions shown; findings below may reference images not displayed]

FINDINGS: Alignment: Grade 1 anterolisthesis at T1-2

Vertebrae: C3-6 ACDF.  No acute abnormality.

Cord: Normal signal and morphology.

Posterior Fossa, vertebral arteries, paraspinal tissues: Negative.

Disc levels:

C1-2: [DATE].

C2-3: Normal disc space and facet joints. There is no spinal canal
stenosis. No neural foraminal stenosis.

C3-4: ACDF. There is no spinal canal stenosis. No neural foraminal
stenosis.

C4-5: ACDF. There is no spinal canal stenosis. No neural foraminal
stenosis.

C5-6: ACDF. There is no spinal canal stenosis. No neural foraminal
stenosis.

C6-7: Unchanged small disc bulge with uncovertebral hypertrophy.
There is no spinal canal stenosis. Unchanged mild right and moderate
left neural foraminal stenosis.

C7-T1: Grade 1 anterolisthesis. There is no spinal canal stenosis.
Unchanged moderate right neural foraminal stenosis.
IMPRESSION: 1. Unchanged C3-6 ACDF with no spinal canal stenosis.
2. Unchanged mild right and moderate left C6-7 neural foraminal
stenosis.
3. Unchanged moderate right C7-T1 neural foraminal stenosis.

## 2022-04-30 DIAGNOSIS — M25562 Pain in left knee: Secondary | ICD-10-CM | POA: Diagnosis not present

## 2022-05-09 DIAGNOSIS — M25562 Pain in left knee: Secondary | ICD-10-CM | POA: Diagnosis not present

## 2022-05-09 DIAGNOSIS — M25512 Pain in left shoulder: Secondary | ICD-10-CM | POA: Diagnosis not present

## 2022-05-09 DIAGNOSIS — M4712 Other spondylosis with myelopathy, cervical region: Secondary | ICD-10-CM | POA: Diagnosis not present

## 2022-05-09 DIAGNOSIS — M47816 Spondylosis without myelopathy or radiculopathy, lumbar region: Secondary | ICD-10-CM | POA: Diagnosis not present

## 2022-05-22 DIAGNOSIS — M542 Cervicalgia: Secondary | ICD-10-CM | POA: Diagnosis not present

## 2022-05-22 DIAGNOSIS — M47812 Spondylosis without myelopathy or radiculopathy, cervical region: Secondary | ICD-10-CM | POA: Diagnosis not present

## 2022-05-22 DIAGNOSIS — G629 Polyneuropathy, unspecified: Secondary | ICD-10-CM | POA: Diagnosis not present

## 2022-05-24 DIAGNOSIS — F5101 Primary insomnia: Secondary | ICD-10-CM | POA: Diagnosis not present

## 2022-05-24 DIAGNOSIS — E2839 Other primary ovarian failure: Secondary | ICD-10-CM | POA: Diagnosis not present

## 2022-05-24 DIAGNOSIS — K219 Gastro-esophageal reflux disease without esophagitis: Secondary | ICD-10-CM | POA: Diagnosis not present

## 2022-05-24 DIAGNOSIS — N1831 Chronic kidney disease, stage 3a: Secondary | ICD-10-CM | POA: Diagnosis not present

## 2022-05-24 DIAGNOSIS — Z1159 Encounter for screening for other viral diseases: Secondary | ICD-10-CM | POA: Diagnosis not present

## 2022-05-24 DIAGNOSIS — Z0000 Encounter for general adult medical examination without abnormal findings: Secondary | ICD-10-CM | POA: Diagnosis not present

## 2022-05-24 DIAGNOSIS — F411 Generalized anxiety disorder: Secondary | ICD-10-CM | POA: Diagnosis not present

## 2022-05-24 DIAGNOSIS — R69 Illness, unspecified: Secondary | ICD-10-CM | POA: Diagnosis not present

## 2022-05-24 DIAGNOSIS — F324 Major depressive disorder, single episode, in partial remission: Secondary | ICD-10-CM | POA: Diagnosis not present

## 2022-05-24 DIAGNOSIS — G894 Chronic pain syndrome: Secondary | ICD-10-CM | POA: Diagnosis not present

## 2022-05-24 DIAGNOSIS — E559 Vitamin D deficiency, unspecified: Secondary | ICD-10-CM | POA: Diagnosis not present

## 2022-05-24 DIAGNOSIS — E7800 Pure hypercholesterolemia, unspecified: Secondary | ICD-10-CM | POA: Diagnosis not present

## 2022-05-24 DIAGNOSIS — R030 Elevated blood-pressure reading, without diagnosis of hypertension: Secondary | ICD-10-CM | POA: Diagnosis not present

## 2022-05-24 DIAGNOSIS — Z1211 Encounter for screening for malignant neoplasm of colon: Secondary | ICD-10-CM | POA: Diagnosis not present

## 2022-05-28 DIAGNOSIS — M25561 Pain in right knee: Secondary | ICD-10-CM | POA: Diagnosis not present

## 2022-05-28 DIAGNOSIS — M25562 Pain in left knee: Secondary | ICD-10-CM | POA: Diagnosis not present

## 2022-05-28 DIAGNOSIS — M25512 Pain in left shoulder: Secondary | ICD-10-CM | POA: Diagnosis not present

## 2022-05-30 ENCOUNTER — Other Ambulatory Visit: Payer: Self-pay | Admitting: Family Medicine

## 2022-05-30 DIAGNOSIS — E2839 Other primary ovarian failure: Secondary | ICD-10-CM

## 2022-06-11 DIAGNOSIS — M25512 Pain in left shoulder: Secondary | ICD-10-CM | POA: Diagnosis not present

## 2022-06-13 ENCOUNTER — Ambulatory Visit (INDEPENDENT_AMBULATORY_CARE_PROVIDER_SITE_OTHER): Payer: Medicare HMO

## 2022-06-13 ENCOUNTER — Other Ambulatory Visit: Payer: Self-pay | Admitting: Family Medicine

## 2022-06-13 DIAGNOSIS — E2839 Other primary ovarian failure: Secondary | ICD-10-CM

## 2022-06-13 DIAGNOSIS — Z780 Asymptomatic menopausal state: Secondary | ICD-10-CM | POA: Diagnosis not present

## 2022-06-13 DIAGNOSIS — M85851 Other specified disorders of bone density and structure, right thigh: Secondary | ICD-10-CM | POA: Diagnosis not present

## 2022-06-13 DIAGNOSIS — Z1231 Encounter for screening mammogram for malignant neoplasm of breast: Secondary | ICD-10-CM

## 2022-06-14 ENCOUNTER — Ambulatory Visit: Payer: Medicare HMO

## 2022-06-22 DIAGNOSIS — M25512 Pain in left shoulder: Secondary | ICD-10-CM | POA: Diagnosis not present

## 2022-06-25 DIAGNOSIS — M25562 Pain in left knee: Secondary | ICD-10-CM | POA: Diagnosis not present

## 2022-06-27 ENCOUNTER — Ambulatory Visit (INDEPENDENT_AMBULATORY_CARE_PROVIDER_SITE_OTHER): Payer: Medicare HMO

## 2022-06-27 DIAGNOSIS — Z1231 Encounter for screening mammogram for malignant neoplasm of breast: Secondary | ICD-10-CM

## 2022-07-04 DIAGNOSIS — M25562 Pain in left knee: Secondary | ICD-10-CM | POA: Diagnosis not present

## 2022-07-04 DIAGNOSIS — M25512 Pain in left shoulder: Secondary | ICD-10-CM | POA: Diagnosis not present

## 2022-07-04 DIAGNOSIS — M47816 Spondylosis without myelopathy or radiculopathy, lumbar region: Secondary | ICD-10-CM | POA: Diagnosis not present

## 2022-07-04 DIAGNOSIS — M4712 Other spondylosis with myelopathy, cervical region: Secondary | ICD-10-CM | POA: Diagnosis not present

## 2022-08-01 DIAGNOSIS — I10 Essential (primary) hypertension: Secondary | ICD-10-CM | POA: Diagnosis not present

## 2022-08-09 DIAGNOSIS — M1712 Unilateral primary osteoarthritis, left knee: Secondary | ICD-10-CM | POA: Diagnosis not present

## 2022-08-16 DIAGNOSIS — M1712 Unilateral primary osteoarthritis, left knee: Secondary | ICD-10-CM | POA: Diagnosis not present

## 2022-08-21 DIAGNOSIS — R5383 Other fatigue: Secondary | ICD-10-CM | POA: Diagnosis not present

## 2022-08-21 DIAGNOSIS — Z1331 Encounter for screening for depression: Secondary | ICD-10-CM | POA: Diagnosis not present

## 2022-08-21 DIAGNOSIS — N1831 Chronic kidney disease, stage 3a: Secondary | ICD-10-CM | POA: Diagnosis not present

## 2022-08-21 DIAGNOSIS — G894 Chronic pain syndrome: Secondary | ICD-10-CM | POA: Diagnosis not present

## 2022-08-21 DIAGNOSIS — E669 Obesity, unspecified: Secondary | ICD-10-CM | POA: Diagnosis not present

## 2022-08-21 DIAGNOSIS — E8889 Other specified metabolic disorders: Secondary | ICD-10-CM | POA: Diagnosis not present

## 2022-08-23 DIAGNOSIS — M1712 Unilateral primary osteoarthritis, left knee: Secondary | ICD-10-CM | POA: Diagnosis not present

## 2022-09-04 DIAGNOSIS — Z79891 Long term (current) use of opiate analgesic: Secondary | ICD-10-CM | POA: Diagnosis not present

## 2022-09-04 DIAGNOSIS — M47816 Spondylosis without myelopathy or radiculopathy, lumbar region: Secondary | ICD-10-CM | POA: Diagnosis not present

## 2022-09-04 DIAGNOSIS — E669 Obesity, unspecified: Secondary | ICD-10-CM | POA: Diagnosis not present

## 2022-09-04 DIAGNOSIS — E785 Hyperlipidemia, unspecified: Secondary | ICD-10-CM | POA: Diagnosis not present

## 2022-09-04 DIAGNOSIS — M4712 Other spondylosis with myelopathy, cervical region: Secondary | ICD-10-CM | POA: Diagnosis not present

## 2022-09-04 DIAGNOSIS — G894 Chronic pain syndrome: Secondary | ICD-10-CM | POA: Diagnosis not present

## 2022-09-04 DIAGNOSIS — N1831 Chronic kidney disease, stage 3a: Secondary | ICD-10-CM | POA: Diagnosis not present

## 2022-09-04 DIAGNOSIS — M25562 Pain in left knee: Secondary | ICD-10-CM | POA: Diagnosis not present

## 2022-09-04 DIAGNOSIS — M25512 Pain in left shoulder: Secondary | ICD-10-CM | POA: Diagnosis not present

## 2022-09-27 DIAGNOSIS — N1831 Chronic kidney disease, stage 3a: Secondary | ICD-10-CM | POA: Diagnosis not present

## 2022-09-27 DIAGNOSIS — E669 Obesity, unspecified: Secondary | ICD-10-CM | POA: Diagnosis not present

## 2022-09-27 DIAGNOSIS — G894 Chronic pain syndrome: Secondary | ICD-10-CM | POA: Diagnosis not present

## 2022-09-27 DIAGNOSIS — E785 Hyperlipidemia, unspecified: Secondary | ICD-10-CM | POA: Diagnosis not present

## 2022-09-27 DIAGNOSIS — Z6836 Body mass index (BMI) 36.0-36.9, adult: Secondary | ICD-10-CM | POA: Diagnosis not present

## 2022-10-30 DIAGNOSIS — G894 Chronic pain syndrome: Secondary | ICD-10-CM | POA: Diagnosis not present

## 2022-10-30 DIAGNOSIS — M25562 Pain in left knee: Secondary | ICD-10-CM | POA: Diagnosis not present

## 2022-10-30 DIAGNOSIS — N1831 Chronic kidney disease, stage 3a: Secondary | ICD-10-CM | POA: Diagnosis not present

## 2022-10-30 DIAGNOSIS — E785 Hyperlipidemia, unspecified: Secondary | ICD-10-CM | POA: Diagnosis not present

## 2022-10-30 DIAGNOSIS — M25512 Pain in left shoulder: Secondary | ICD-10-CM | POA: Diagnosis not present

## 2022-10-30 DIAGNOSIS — M4712 Other spondylosis with myelopathy, cervical region: Secondary | ICD-10-CM | POA: Diagnosis not present

## 2022-10-30 DIAGNOSIS — E669 Obesity, unspecified: Secondary | ICD-10-CM | POA: Diagnosis not present

## 2022-10-30 DIAGNOSIS — Z6836 Body mass index (BMI) 36.0-36.9, adult: Secondary | ICD-10-CM | POA: Diagnosis not present

## 2022-10-30 DIAGNOSIS — M47816 Spondylosis without myelopathy or radiculopathy, lumbar region: Secondary | ICD-10-CM | POA: Diagnosis not present

## 2022-11-28 DIAGNOSIS — K219 Gastro-esophageal reflux disease without esophagitis: Secondary | ICD-10-CM | POA: Diagnosis not present

## 2022-11-28 DIAGNOSIS — I10 Essential (primary) hypertension: Secondary | ICD-10-CM | POA: Diagnosis not present

## 2022-11-28 DIAGNOSIS — G894 Chronic pain syndrome: Secondary | ICD-10-CM | POA: Diagnosis not present

## 2022-11-28 DIAGNOSIS — R69 Illness, unspecified: Secondary | ICD-10-CM | POA: Diagnosis not present

## 2022-11-28 DIAGNOSIS — E7800 Pure hypercholesterolemia, unspecified: Secondary | ICD-10-CM | POA: Diagnosis not present

## 2022-11-28 DIAGNOSIS — E559 Vitamin D deficiency, unspecified: Secondary | ICD-10-CM | POA: Diagnosis not present

## 2022-11-28 DIAGNOSIS — R609 Edema, unspecified: Secondary | ICD-10-CM | POA: Diagnosis not present

## 2022-11-28 DIAGNOSIS — F419 Anxiety disorder, unspecified: Secondary | ICD-10-CM | POA: Diagnosis not present

## 2022-11-28 DIAGNOSIS — N1831 Chronic kidney disease, stage 3a: Secondary | ICD-10-CM | POA: Diagnosis not present

## 2022-11-28 DIAGNOSIS — F5101 Primary insomnia: Secondary | ICD-10-CM | POA: Diagnosis not present

## 2022-11-29 DIAGNOSIS — E669 Obesity, unspecified: Secondary | ICD-10-CM | POA: Diagnosis not present

## 2022-11-29 DIAGNOSIS — N1831 Chronic kidney disease, stage 3a: Secondary | ICD-10-CM | POA: Diagnosis not present

## 2022-11-29 DIAGNOSIS — G894 Chronic pain syndrome: Secondary | ICD-10-CM | POA: Diagnosis not present

## 2022-11-29 DIAGNOSIS — E785 Hyperlipidemia, unspecified: Secondary | ICD-10-CM | POA: Diagnosis not present

## 2022-11-29 DIAGNOSIS — Z6835 Body mass index (BMI) 35.0-35.9, adult: Secondary | ICD-10-CM | POA: Diagnosis not present

## 2022-12-20 DIAGNOSIS — E785 Hyperlipidemia, unspecified: Secondary | ICD-10-CM | POA: Diagnosis not present

## 2022-12-20 DIAGNOSIS — N1831 Chronic kidney disease, stage 3a: Secondary | ICD-10-CM | POA: Diagnosis not present

## 2022-12-20 DIAGNOSIS — Z6834 Body mass index (BMI) 34.0-34.9, adult: Secondary | ICD-10-CM | POA: Diagnosis not present

## 2022-12-20 DIAGNOSIS — E669 Obesity, unspecified: Secondary | ICD-10-CM | POA: Diagnosis not present

## 2022-12-20 DIAGNOSIS — G894 Chronic pain syndrome: Secondary | ICD-10-CM | POA: Diagnosis not present

## 2022-12-26 DIAGNOSIS — M25512 Pain in left shoulder: Secondary | ICD-10-CM | POA: Diagnosis not present

## 2022-12-26 DIAGNOSIS — G894 Chronic pain syndrome: Secondary | ICD-10-CM | POA: Diagnosis not present

## 2022-12-26 DIAGNOSIS — M47816 Spondylosis without myelopathy or radiculopathy, lumbar region: Secondary | ICD-10-CM | POA: Diagnosis not present

## 2022-12-26 DIAGNOSIS — M25562 Pain in left knee: Secondary | ICD-10-CM | POA: Diagnosis not present

## 2022-12-26 DIAGNOSIS — M4712 Other spondylosis with myelopathy, cervical region: Secondary | ICD-10-CM | POA: Diagnosis not present

## 2022-12-26 DIAGNOSIS — Z79891 Long term (current) use of opiate analgesic: Secondary | ICD-10-CM | POA: Diagnosis not present

## 2023-01-03 DIAGNOSIS — M1712 Unilateral primary osteoarthritis, left knee: Secondary | ICD-10-CM | POA: Diagnosis not present

## 2023-01-09 DIAGNOSIS — M25562 Pain in left knee: Secondary | ICD-10-CM | POA: Diagnosis not present

## 2023-01-15 DIAGNOSIS — M1712 Unilateral primary osteoarthritis, left knee: Secondary | ICD-10-CM | POA: Diagnosis not present

## 2023-01-25 DIAGNOSIS — M1712 Unilateral primary osteoarthritis, left knee: Secondary | ICD-10-CM | POA: Diagnosis not present

## 2023-01-29 DIAGNOSIS — G894 Chronic pain syndrome: Secondary | ICD-10-CM | POA: Diagnosis not present

## 2023-01-29 DIAGNOSIS — E785 Hyperlipidemia, unspecified: Secondary | ICD-10-CM | POA: Diagnosis not present

## 2023-01-29 DIAGNOSIS — Z6833 Body mass index (BMI) 33.0-33.9, adult: Secondary | ICD-10-CM | POA: Diagnosis not present

## 2023-01-29 DIAGNOSIS — F4321 Adjustment disorder with depressed mood: Secondary | ICD-10-CM | POA: Diagnosis not present

## 2023-01-29 DIAGNOSIS — N1831 Chronic kidney disease, stage 3a: Secondary | ICD-10-CM | POA: Diagnosis not present

## 2023-01-29 DIAGNOSIS — R69 Illness, unspecified: Secondary | ICD-10-CM | POA: Diagnosis not present

## 2023-01-29 DIAGNOSIS — E669 Obesity, unspecified: Secondary | ICD-10-CM | POA: Diagnosis not present

## 2023-02-07 DIAGNOSIS — S83272A Complex tear of lateral meniscus, current injury, left knee, initial encounter: Secondary | ICD-10-CM | POA: Diagnosis not present

## 2023-02-07 DIAGNOSIS — X58XXXA Exposure to other specified factors, initial encounter: Secondary | ICD-10-CM | POA: Diagnosis not present

## 2023-02-07 DIAGNOSIS — M6752 Plica syndrome, left knee: Secondary | ICD-10-CM | POA: Diagnosis not present

## 2023-02-07 DIAGNOSIS — Y999 Unspecified external cause status: Secondary | ICD-10-CM | POA: Diagnosis not present

## 2023-02-07 DIAGNOSIS — S83282A Other tear of lateral meniscus, current injury, left knee, initial encounter: Secondary | ICD-10-CM | POA: Diagnosis not present

## 2023-02-07 DIAGNOSIS — G8918 Other acute postprocedural pain: Secondary | ICD-10-CM | POA: Diagnosis not present

## 2023-02-07 DIAGNOSIS — M2242 Chondromalacia patellae, left knee: Secondary | ICD-10-CM | POA: Diagnosis not present

## 2023-02-18 DIAGNOSIS — M6752 Plica syndrome, left knee: Secondary | ICD-10-CM | POA: Diagnosis not present

## 2023-02-27 DIAGNOSIS — M4712 Other spondylosis with myelopathy, cervical region: Secondary | ICD-10-CM | POA: Diagnosis not present

## 2023-02-27 DIAGNOSIS — M25512 Pain in left shoulder: Secondary | ICD-10-CM | POA: Diagnosis not present

## 2023-02-27 DIAGNOSIS — M47816 Spondylosis without myelopathy or radiculopathy, lumbar region: Secondary | ICD-10-CM | POA: Diagnosis not present

## 2023-02-27 DIAGNOSIS — G894 Chronic pain syndrome: Secondary | ICD-10-CM | POA: Diagnosis not present

## 2023-03-11 DIAGNOSIS — R69 Illness, unspecified: Secondary | ICD-10-CM | POA: Diagnosis not present

## 2023-03-11 DIAGNOSIS — G894 Chronic pain syndrome: Secondary | ICD-10-CM | POA: Diagnosis not present

## 2023-03-11 DIAGNOSIS — E669 Obesity, unspecified: Secondary | ICD-10-CM | POA: Diagnosis not present

## 2023-03-11 DIAGNOSIS — Z6834 Body mass index (BMI) 34.0-34.9, adult: Secondary | ICD-10-CM | POA: Diagnosis not present

## 2023-03-11 DIAGNOSIS — E785 Hyperlipidemia, unspecified: Secondary | ICD-10-CM | POA: Diagnosis not present

## 2023-03-11 DIAGNOSIS — N1831 Chronic kidney disease, stage 3a: Secondary | ICD-10-CM | POA: Diagnosis not present

## 2023-04-16 DIAGNOSIS — N1831 Chronic kidney disease, stage 3a: Secondary | ICD-10-CM | POA: Diagnosis not present

## 2023-04-16 DIAGNOSIS — E669 Obesity, unspecified: Secondary | ICD-10-CM | POA: Diagnosis not present

## 2023-04-16 DIAGNOSIS — R69 Illness, unspecified: Secondary | ICD-10-CM | POA: Diagnosis not present

## 2023-04-16 DIAGNOSIS — E785 Hyperlipidemia, unspecified: Secondary | ICD-10-CM | POA: Diagnosis not present

## 2023-04-16 DIAGNOSIS — F4321 Adjustment disorder with depressed mood: Secondary | ICD-10-CM | POA: Diagnosis not present

## 2023-04-16 DIAGNOSIS — G894 Chronic pain syndrome: Secondary | ICD-10-CM | POA: Diagnosis not present

## 2023-04-16 DIAGNOSIS — Z6832 Body mass index (BMI) 32.0-32.9, adult: Secondary | ICD-10-CM | POA: Diagnosis not present

## 2023-04-23 DIAGNOSIS — F4323 Adjustment disorder with mixed anxiety and depressed mood: Secondary | ICD-10-CM | POA: Diagnosis not present

## 2023-04-29 DIAGNOSIS — G894 Chronic pain syndrome: Secondary | ICD-10-CM | POA: Diagnosis not present

## 2023-04-29 DIAGNOSIS — M4712 Other spondylosis with myelopathy, cervical region: Secondary | ICD-10-CM | POA: Diagnosis not present

## 2023-04-29 DIAGNOSIS — M47816 Spondylosis without myelopathy or radiculopathy, lumbar region: Secondary | ICD-10-CM | POA: Diagnosis not present

## 2023-04-29 DIAGNOSIS — M25512 Pain in left shoulder: Secondary | ICD-10-CM | POA: Diagnosis not present

## 2023-05-24 DIAGNOSIS — F4323 Adjustment disorder with mixed anxiety and depressed mood: Secondary | ICD-10-CM | POA: Diagnosis not present

## 2023-06-05 DIAGNOSIS — F4321 Adjustment disorder with depressed mood: Secondary | ICD-10-CM | POA: Diagnosis not present

## 2023-06-05 DIAGNOSIS — E669 Obesity, unspecified: Secondary | ICD-10-CM | POA: Diagnosis not present

## 2023-06-05 DIAGNOSIS — N1831 Chronic kidney disease, stage 3a: Secondary | ICD-10-CM | POA: Diagnosis not present

## 2023-06-05 DIAGNOSIS — Z6832 Body mass index (BMI) 32.0-32.9, adult: Secondary | ICD-10-CM | POA: Diagnosis not present

## 2023-06-05 DIAGNOSIS — E785 Hyperlipidemia, unspecified: Secondary | ICD-10-CM | POA: Diagnosis not present

## 2023-06-12 DIAGNOSIS — Z0000 Encounter for general adult medical examination without abnormal findings: Secondary | ICD-10-CM | POA: Diagnosis not present

## 2023-06-12 DIAGNOSIS — N1831 Chronic kidney disease, stage 3a: Secondary | ICD-10-CM | POA: Diagnosis not present

## 2023-06-12 DIAGNOSIS — F5101 Primary insomnia: Secondary | ICD-10-CM | POA: Diagnosis not present

## 2023-06-12 DIAGNOSIS — K219 Gastro-esophageal reflux disease without esophagitis: Secondary | ICD-10-CM | POA: Diagnosis not present

## 2023-06-12 DIAGNOSIS — E559 Vitamin D deficiency, unspecified: Secondary | ICD-10-CM | POA: Diagnosis not present

## 2023-06-12 DIAGNOSIS — E669 Obesity, unspecified: Secondary | ICD-10-CM | POA: Diagnosis not present

## 2023-06-12 DIAGNOSIS — F411 Generalized anxiety disorder: Secondary | ICD-10-CM | POA: Diagnosis not present

## 2023-06-12 DIAGNOSIS — G894 Chronic pain syndrome: Secondary | ICD-10-CM | POA: Diagnosis not present

## 2023-06-12 DIAGNOSIS — F324 Major depressive disorder, single episode, in partial remission: Secondary | ICD-10-CM | POA: Diagnosis not present

## 2023-06-12 DIAGNOSIS — I10 Essential (primary) hypertension: Secondary | ICD-10-CM | POA: Diagnosis not present

## 2023-06-12 DIAGNOSIS — R609 Edema, unspecified: Secondary | ICD-10-CM | POA: Diagnosis not present

## 2023-06-12 DIAGNOSIS — E7800 Pure hypercholesterolemia, unspecified: Secondary | ICD-10-CM | POA: Diagnosis not present

## 2023-06-23 DIAGNOSIS — F4323 Adjustment disorder with mixed anxiety and depressed mood: Secondary | ICD-10-CM | POA: Diagnosis not present

## 2023-06-26 DIAGNOSIS — M25512 Pain in left shoulder: Secondary | ICD-10-CM | POA: Diagnosis not present

## 2023-06-26 DIAGNOSIS — M47816 Spondylosis without myelopathy or radiculopathy, lumbar region: Secondary | ICD-10-CM | POA: Diagnosis not present

## 2023-06-26 DIAGNOSIS — M4712 Other spondylosis with myelopathy, cervical region: Secondary | ICD-10-CM | POA: Diagnosis not present

## 2023-06-26 DIAGNOSIS — G894 Chronic pain syndrome: Secondary | ICD-10-CM | POA: Diagnosis not present

## 2023-07-08 DIAGNOSIS — N1831 Chronic kidney disease, stage 3a: Secondary | ICD-10-CM | POA: Diagnosis not present

## 2023-07-08 DIAGNOSIS — Z6834 Body mass index (BMI) 34.0-34.9, adult: Secondary | ICD-10-CM | POA: Diagnosis not present

## 2023-07-08 DIAGNOSIS — E785 Hyperlipidemia, unspecified: Secondary | ICD-10-CM | POA: Diagnosis not present

## 2023-07-08 DIAGNOSIS — F4321 Adjustment disorder with depressed mood: Secondary | ICD-10-CM | POA: Diagnosis not present

## 2023-07-08 DIAGNOSIS — E669 Obesity, unspecified: Secondary | ICD-10-CM | POA: Diagnosis not present

## 2023-07-08 DIAGNOSIS — G894 Chronic pain syndrome: Secondary | ICD-10-CM | POA: Diagnosis not present

## 2023-07-24 DIAGNOSIS — M4712 Other spondylosis with myelopathy, cervical region: Secondary | ICD-10-CM | POA: Diagnosis not present

## 2023-07-24 DIAGNOSIS — F4323 Adjustment disorder with mixed anxiety and depressed mood: Secondary | ICD-10-CM | POA: Diagnosis not present

## 2023-07-24 DIAGNOSIS — M47816 Spondylosis without myelopathy or radiculopathy, lumbar region: Secondary | ICD-10-CM | POA: Diagnosis not present

## 2023-07-24 DIAGNOSIS — M25512 Pain in left shoulder: Secondary | ICD-10-CM | POA: Diagnosis not present

## 2023-07-24 DIAGNOSIS — G894 Chronic pain syndrome: Secondary | ICD-10-CM | POA: Diagnosis not present

## 2023-07-24 DIAGNOSIS — Z79891 Long term (current) use of opiate analgesic: Secondary | ICD-10-CM | POA: Diagnosis not present

## 2023-08-13 DIAGNOSIS — G894 Chronic pain syndrome: Secondary | ICD-10-CM | POA: Diagnosis not present

## 2023-08-13 DIAGNOSIS — E785 Hyperlipidemia, unspecified: Secondary | ICD-10-CM | POA: Diagnosis not present

## 2023-08-13 DIAGNOSIS — N1831 Chronic kidney disease, stage 3a: Secondary | ICD-10-CM | POA: Diagnosis not present

## 2023-08-13 DIAGNOSIS — F4321 Adjustment disorder with depressed mood: Secondary | ICD-10-CM | POA: Diagnosis not present

## 2023-08-13 DIAGNOSIS — Z6832 Body mass index (BMI) 32.0-32.9, adult: Secondary | ICD-10-CM | POA: Diagnosis not present

## 2023-08-13 DIAGNOSIS — E669 Obesity, unspecified: Secondary | ICD-10-CM | POA: Diagnosis not present

## 2023-08-24 DIAGNOSIS — F4323 Adjustment disorder with mixed anxiety and depressed mood: Secondary | ICD-10-CM | POA: Diagnosis not present

## 2023-09-04 DIAGNOSIS — Z86010 Personal history of colonic polyps: Secondary | ICD-10-CM | POA: Diagnosis not present

## 2023-09-04 DIAGNOSIS — Z1211 Encounter for screening for malignant neoplasm of colon: Secondary | ICD-10-CM | POA: Diagnosis not present

## 2023-09-04 DIAGNOSIS — F419 Anxiety disorder, unspecified: Secondary | ICD-10-CM | POA: Diagnosis not present

## 2023-09-04 DIAGNOSIS — Z09 Encounter for follow-up examination after completed treatment for conditions other than malignant neoplasm: Secondary | ICD-10-CM | POA: Diagnosis not present

## 2023-09-09 DIAGNOSIS — Z79899 Other long term (current) drug therapy: Secondary | ICD-10-CM | POA: Diagnosis not present

## 2023-09-09 DIAGNOSIS — T402X1A Poisoning by other opioids, accidental (unintentional), initial encounter: Secondary | ICD-10-CM | POA: Diagnosis not present

## 2023-09-09 DIAGNOSIS — M47812 Spondylosis without myelopathy or radiculopathy, cervical region: Secondary | ICD-10-CM | POA: Diagnosis not present

## 2023-09-09 DIAGNOSIS — F4321 Adjustment disorder with depressed mood: Secondary | ICD-10-CM | POA: Diagnosis not present

## 2023-09-09 DIAGNOSIS — M545 Low back pain, unspecified: Secondary | ICD-10-CM | POA: Diagnosis not present

## 2023-09-09 DIAGNOSIS — T424X1A Poisoning by benzodiazepines, accidental (unintentional), initial encounter: Secondary | ICD-10-CM | POA: Diagnosis not present

## 2023-09-09 DIAGNOSIS — I959 Hypotension, unspecified: Secondary | ICD-10-CM | POA: Diagnosis not present

## 2023-09-09 DIAGNOSIS — R252 Cramp and spasm: Secondary | ICD-10-CM | POA: Diagnosis not present

## 2023-09-09 DIAGNOSIS — Z981 Arthrodesis status: Secondary | ICD-10-CM | POA: Diagnosis not present

## 2023-09-09 DIAGNOSIS — E669 Obesity, unspecified: Secondary | ICD-10-CM | POA: Diagnosis not present

## 2023-09-09 DIAGNOSIS — Z6834 Body mass index (BMI) 34.0-34.9, adult: Secondary | ICD-10-CM | POA: Diagnosis not present

## 2023-09-09 DIAGNOSIS — M542 Cervicalgia: Secondary | ICD-10-CM | POA: Diagnosis not present

## 2023-09-09 DIAGNOSIS — M4313 Spondylolisthesis, cervicothoracic region: Secondary | ICD-10-CM | POA: Diagnosis not present

## 2023-09-09 DIAGNOSIS — Z79891 Long term (current) use of opiate analgesic: Secondary | ICD-10-CM | POA: Diagnosis not present

## 2023-09-09 DIAGNOSIS — M79662 Pain in left lower leg: Secondary | ICD-10-CM | POA: Diagnosis not present

## 2023-09-09 DIAGNOSIS — G894 Chronic pain syndrome: Secondary | ICD-10-CM | POA: Diagnosis not present

## 2023-09-09 DIAGNOSIS — R4182 Altered mental status, unspecified: Secondary | ICD-10-CM | POA: Diagnosis not present

## 2023-09-09 DIAGNOSIS — M79661 Pain in right lower leg: Secondary | ICD-10-CM | POA: Diagnosis not present

## 2023-09-09 DIAGNOSIS — G8929 Other chronic pain: Secondary | ICD-10-CM | POA: Diagnosis not present

## 2023-09-23 DIAGNOSIS — M25512 Pain in left shoulder: Secondary | ICD-10-CM | POA: Diagnosis not present

## 2023-09-23 DIAGNOSIS — M47816 Spondylosis without myelopathy or radiculopathy, lumbar region: Secondary | ICD-10-CM | POA: Diagnosis not present

## 2023-09-23 DIAGNOSIS — G894 Chronic pain syndrome: Secondary | ICD-10-CM | POA: Diagnosis not present

## 2023-09-23 DIAGNOSIS — M4712 Other spondylosis with myelopathy, cervical region: Secondary | ICD-10-CM | POA: Diagnosis not present

## 2023-11-05 DIAGNOSIS — E669 Obesity, unspecified: Secondary | ICD-10-CM | POA: Diagnosis not present

## 2023-11-05 DIAGNOSIS — M47816 Spondylosis without myelopathy or radiculopathy, lumbar region: Secondary | ICD-10-CM | POA: Diagnosis not present

## 2023-11-05 DIAGNOSIS — Z6834 Body mass index (BMI) 34.0-34.9, adult: Secondary | ICD-10-CM | POA: Diagnosis not present

## 2023-11-05 DIAGNOSIS — Z79891 Long term (current) use of opiate analgesic: Secondary | ICD-10-CM | POA: Diagnosis not present

## 2023-11-05 DIAGNOSIS — M4712 Other spondylosis with myelopathy, cervical region: Secondary | ICD-10-CM | POA: Diagnosis not present

## 2023-11-05 DIAGNOSIS — N1831 Chronic kidney disease, stage 3a: Secondary | ICD-10-CM | POA: Diagnosis not present

## 2023-11-05 DIAGNOSIS — F4321 Adjustment disorder with depressed mood: Secondary | ICD-10-CM | POA: Diagnosis not present

## 2023-11-05 DIAGNOSIS — E785 Hyperlipidemia, unspecified: Secondary | ICD-10-CM | POA: Diagnosis not present

## 2023-11-05 DIAGNOSIS — G894 Chronic pain syndrome: Secondary | ICD-10-CM | POA: Diagnosis not present

## 2023-11-05 DIAGNOSIS — M25512 Pain in left shoulder: Secondary | ICD-10-CM | POA: Diagnosis not present

## 2023-11-19 DIAGNOSIS — M47816 Spondylosis without myelopathy or radiculopathy, lumbar region: Secondary | ICD-10-CM | POA: Diagnosis not present

## 2023-11-19 DIAGNOSIS — G894 Chronic pain syndrome: Secondary | ICD-10-CM | POA: Diagnosis not present

## 2023-11-19 DIAGNOSIS — M4712 Other spondylosis with myelopathy, cervical region: Secondary | ICD-10-CM | POA: Diagnosis not present

## 2023-11-19 DIAGNOSIS — M25512 Pain in left shoulder: Secondary | ICD-10-CM | POA: Diagnosis not present

## 2023-11-20 DIAGNOSIS — M2021 Hallux rigidus, right foot: Secondary | ICD-10-CM | POA: Diagnosis not present

## 2023-11-26 DIAGNOSIS — R0789 Other chest pain: Secondary | ICD-10-CM | POA: Diagnosis not present

## 2023-11-26 DIAGNOSIS — R519 Headache, unspecified: Secondary | ICD-10-CM | POA: Diagnosis not present

## 2023-11-26 DIAGNOSIS — R079 Chest pain, unspecified: Secondary | ICD-10-CM | POA: Diagnosis not present

## 2023-11-26 DIAGNOSIS — I10 Essential (primary) hypertension: Secondary | ICD-10-CM | POA: Diagnosis not present

## 2023-11-26 DIAGNOSIS — R002 Palpitations: Secondary | ICD-10-CM | POA: Diagnosis not present

## 2023-11-27 DIAGNOSIS — R079 Chest pain, unspecified: Secondary | ICD-10-CM | POA: Diagnosis not present

## 2023-11-27 DIAGNOSIS — R001 Bradycardia, unspecified: Secondary | ICD-10-CM | POA: Diagnosis not present

## 2023-11-27 DIAGNOSIS — R002 Palpitations: Secondary | ICD-10-CM | POA: Diagnosis not present

## 2023-11-29 DIAGNOSIS — M79672 Pain in left foot: Secondary | ICD-10-CM | POA: Diagnosis not present

## 2023-12-11 DIAGNOSIS — F324 Major depressive disorder, single episode, in partial remission: Secondary | ICD-10-CM | POA: Diagnosis not present

## 2023-12-11 DIAGNOSIS — N1831 Chronic kidney disease, stage 3a: Secondary | ICD-10-CM | POA: Diagnosis not present

## 2023-12-11 DIAGNOSIS — R609 Edema, unspecified: Secondary | ICD-10-CM | POA: Diagnosis not present

## 2023-12-11 DIAGNOSIS — E7800 Pure hypercholesterolemia, unspecified: Secondary | ICD-10-CM | POA: Diagnosis not present

## 2023-12-11 DIAGNOSIS — G894 Chronic pain syndrome: Secondary | ICD-10-CM | POA: Diagnosis not present

## 2023-12-11 DIAGNOSIS — R030 Elevated blood-pressure reading, without diagnosis of hypertension: Secondary | ICD-10-CM | POA: Diagnosis not present

## 2023-12-11 DIAGNOSIS — E559 Vitamin D deficiency, unspecified: Secondary | ICD-10-CM | POA: Diagnosis not present

## 2023-12-11 DIAGNOSIS — F5101 Primary insomnia: Secondary | ICD-10-CM | POA: Diagnosis not present

## 2023-12-11 DIAGNOSIS — I10 Essential (primary) hypertension: Secondary | ICD-10-CM | POA: Diagnosis not present

## 2023-12-11 DIAGNOSIS — K219 Gastro-esophageal reflux disease without esophagitis: Secondary | ICD-10-CM | POA: Diagnosis not present

## 2023-12-12 DIAGNOSIS — M2041 Other hammer toe(s) (acquired), right foot: Secondary | ICD-10-CM | POA: Diagnosis not present

## 2023-12-12 DIAGNOSIS — M89371 Hypertrophy of bone, right ankle and foot: Secondary | ICD-10-CM | POA: Diagnosis not present

## 2023-12-12 DIAGNOSIS — G8918 Other acute postprocedural pain: Secondary | ICD-10-CM | POA: Diagnosis not present

## 2023-12-12 DIAGNOSIS — M2021 Hallux rigidus, right foot: Secondary | ICD-10-CM | POA: Diagnosis not present

## 2023-12-12 DIAGNOSIS — M948X7 Other specified disorders of cartilage, ankle and foot: Secondary | ICD-10-CM | POA: Diagnosis not present

## 2023-12-12 DIAGNOSIS — M898X7 Other specified disorders of bone, ankle and foot: Secondary | ICD-10-CM | POA: Diagnosis not present

## 2023-12-26 DIAGNOSIS — M2021 Hallux rigidus, right foot: Secondary | ICD-10-CM | POA: Diagnosis not present

## 2024-01-22 DIAGNOSIS — M47816 Spondylosis without myelopathy or radiculopathy, lumbar region: Secondary | ICD-10-CM | POA: Diagnosis not present

## 2024-01-22 DIAGNOSIS — G894 Chronic pain syndrome: Secondary | ICD-10-CM | POA: Diagnosis not present

## 2024-01-22 DIAGNOSIS — M4712 Other spondylosis with myelopathy, cervical region: Secondary | ICD-10-CM | POA: Diagnosis not present

## 2024-01-22 DIAGNOSIS — M25512 Pain in left shoulder: Secondary | ICD-10-CM | POA: Diagnosis not present

## 2024-01-27 DIAGNOSIS — M2021 Hallux rigidus, right foot: Secondary | ICD-10-CM | POA: Diagnosis not present

## 2024-02-26 DIAGNOSIS — M2021 Hallux rigidus, right foot: Secondary | ICD-10-CM | POA: Diagnosis not present

## 2024-03-18 DIAGNOSIS — M25512 Pain in left shoulder: Secondary | ICD-10-CM | POA: Diagnosis not present

## 2024-03-18 DIAGNOSIS — M47816 Spondylosis without myelopathy or radiculopathy, lumbar region: Secondary | ICD-10-CM | POA: Diagnosis not present

## 2024-03-18 DIAGNOSIS — G894 Chronic pain syndrome: Secondary | ICD-10-CM | POA: Diagnosis not present

## 2024-03-18 DIAGNOSIS — M4712 Other spondylosis with myelopathy, cervical region: Secondary | ICD-10-CM | POA: Diagnosis not present

## 2024-04-06 DIAGNOSIS — M2021 Hallux rigidus, right foot: Secondary | ICD-10-CM | POA: Diagnosis not present

## 2024-05-13 DIAGNOSIS — G894 Chronic pain syndrome: Secondary | ICD-10-CM | POA: Diagnosis not present

## 2024-05-13 DIAGNOSIS — M47816 Spondylosis without myelopathy or radiculopathy, lumbar region: Secondary | ICD-10-CM | POA: Diagnosis not present

## 2024-05-13 DIAGNOSIS — M25512 Pain in left shoulder: Secondary | ICD-10-CM | POA: Diagnosis not present

## 2024-05-13 DIAGNOSIS — Z79891 Long term (current) use of opiate analgesic: Secondary | ICD-10-CM | POA: Diagnosis not present

## 2024-05-13 DIAGNOSIS — M4712 Other spondylosis with myelopathy, cervical region: Secondary | ICD-10-CM | POA: Diagnosis not present

## 2024-05-23 DIAGNOSIS — E785 Hyperlipidemia, unspecified: Secondary | ICD-10-CM | POA: Diagnosis not present

## 2024-05-23 DIAGNOSIS — N1831 Chronic kidney disease, stage 3a: Secondary | ICD-10-CM | POA: Diagnosis not present

## 2024-05-23 DIAGNOSIS — I10 Essential (primary) hypertension: Secondary | ICD-10-CM | POA: Diagnosis not present

## 2024-05-26 DIAGNOSIS — I10 Essential (primary) hypertension: Secondary | ICD-10-CM | POA: Diagnosis not present

## 2024-05-26 DIAGNOSIS — N1831 Chronic kidney disease, stage 3a: Secondary | ICD-10-CM | POA: Diagnosis not present

## 2024-05-26 DIAGNOSIS — E7800 Pure hypercholesterolemia, unspecified: Secondary | ICD-10-CM | POA: Diagnosis not present

## 2024-05-26 DIAGNOSIS — E559 Vitamin D deficiency, unspecified: Secondary | ICD-10-CM | POA: Diagnosis not present

## 2024-05-26 DIAGNOSIS — D649 Anemia, unspecified: Secondary | ICD-10-CM | POA: Diagnosis not present

## 2024-06-09 DIAGNOSIS — M542 Cervicalgia: Secondary | ICD-10-CM | POA: Diagnosis not present

## 2024-06-09 DIAGNOSIS — Z6837 Body mass index (BMI) 37.0-37.9, adult: Secondary | ICD-10-CM | POA: Diagnosis not present

## 2024-07-03 DIAGNOSIS — M545 Low back pain, unspecified: Secondary | ICD-10-CM | POA: Diagnosis not present

## 2024-07-06 ENCOUNTER — Other Ambulatory Visit: Payer: Self-pay | Admitting: Family Medicine

## 2024-07-06 DIAGNOSIS — Z1231 Encounter for screening mammogram for malignant neoplasm of breast: Secondary | ICD-10-CM

## 2024-07-06 NOTE — Therapy (Signed)
 OUTPATIENT PHYSICAL THERAPY CERVICAL EVALUATION   Patient Name: Brandy Delacruz MRN: [id number] DOB:27-Sep-1961, 63 y.o., female Today's Date: 07/07/2024  END OF SESSION:  PT End of Session - 07/07/24 1124     Visit Number 1    Number of Visits 7    Date for PT Re-Evaluation 08/22/24    Authorization Type [employer] MCR    Progress Note Due on Visit 10    PT Start Time 1126    PT Stop Time 1206    PT Time Calculation (min) 40 min    Activity Tolerance Patient limited by pain    Behavior During Therapy WFL for tasks assessed/performed          Past Medical History:  Diagnosis Date   Anxiety    Arthritis    Chronic kidney disease    Depression    GERD (gastroesophageal reflux disease)    Hypercholesteremia    Hypertension    Past Surgical History:  Procedure Laterality Date   ABDOMINAL HYSTERECTOMY     BACK SURGERY     bladder stimulator     EAR MASTOIDECTOMY W/ COCHLEAR IMPLANT W/ LANDMARK     KNEE SURGERY Right    NECK SURGERY     TONSILLECTOMY     There are no active problems to display for this patient.   PCP: Alben Therisa MATSU, PA  REFERRING PROVIDER: Onetha Kuba, MD   REFERRING DIAG: Spondylosis without myelopathy or radiculopathy, cervical region   THERAPY DIAG:  Cervicalgia  Muscle weakness (generalized)  Abnormal posture  Rationale for Evaluation and Treatment: Rehabilitation  ONSET DATE: chronic   SUBJECTIVE:                                                                                                                                                                                                         SUBJECTIVE STATEMENT: Patient reports worsening of chronic neck pain over the past year. She already knows there is a problem that can only be surgically fixed. She reports having a cervical fusion of C3-6, but now C7-8 are the problem. The pain will course down her shoulder blade and into her Rt arm to digits 4-5 described as burning.  Insurance denied recent MRI request and was instructed that she has to try PT. She has to sleep with the neck upright due to pain.  Hand dominance: Right  PERTINENT HISTORY:  Neck surgery 2017 (per patient C3-6 fusion) Back surgery 2014 (per patient laminectomy)  Arthritis  Chronic kidney disease   PAIN:  Are you having pain? Yes:  NPRS scale: 8 Pain location: neck and Rt upper arm Pain description: continuous burn Aggravating factors: lifting, neck extension Relieving factors: medication   PRECAUTIONS: None  RED FLAGS: None     WEIGHT BEARING RESTRICTIONS: No  FALLS:  Has patient fallen in last 6 months? No  LIVING ENVIRONMENT: Lives with: lives with their family Lives in: House/apartment Stairs: Yes: Internal: flight steps; on left going up Has following equipment at home: None  OCCUPATION: on disability   PLOF: Needs assistance with ADLs and Needs assistance with homemaking  PATIENT GOALS: have surgery and get over the pain.   NEXT MD VISIT: 07/07/24  OBJECTIVE:  Note: Objective measures were completed at Evaluation unless otherwise noted.  DIAGNOSTIC FINDINGS:  No recent imaging on file   PATIENT SURVEYS:  NDI: 39/50; 78% disability   COGNITION: Overall cognitive status: Within functional limits for tasks assessed  SENSATION: Light touch diminished with all dermatomes of RUE   POSTURE: Rt lateral shift in standing, forward head, rounded shoulders   PALPATION: Not assessed    CERVICAL ROM:   Active ROM A/PROM (deg) eval  Flexion 12  Extension 10  Right lateral flexion   Left lateral flexion   Right rotation 12  Left rotation 12   (Blank rows = not tested) pain with all cervical AROM  UPPER EXTREMITY ROM:  Active ROM Right eval Left eval  Shoulder flexion 90 pain 90 pain  Shoulder extension    Shoulder abduction    Shoulder adduction    Shoulder extension    Shoulder internal rotation    Shoulder external rotation    Elbow flexion     Elbow extension    Wrist flexion    Wrist extension    Wrist ulnar deviation    Wrist radial deviation    Wrist pronation    Wrist supination     (Blank rows = not tested)  UPPER EXTREMITY MMT:  MMT Right eval Left eval  Shoulder flexion 3- 3-  Shoulder extension    Shoulder abduction    Shoulder adduction    Shoulder extension    Shoulder internal rotation    Shoulder external rotation    Middle trapezius    Lower trapezius    Elbow flexion 4- 5  Elbow extension 4- 5  Wrist flexion 4 5  Wrist extension 4 4+  Wrist ulnar deviation    Wrist radial deviation    Wrist pronation    Wrist supination    Grip strength 10 30   (Blank rows = not tested)  CERVICAL SPECIAL TESTS:  Not tested   FUNCTIONAL TESTS:  Not tested   [facility] Adult PT Treatment:                                                DATE: 07/07/24 Therapeutic Exercise: Demonstrated,performed, and issued initial HEP.  PATIENT EDUCATION:  Education details: see treatment; POC Person educated: Patient Education method: Explanation, Demonstration, Tactile cues, Verbal cues, and Handouts Education comprehension: verbalized understanding, returned demonstration, verbal cues required, tactile cues required, and needs further education  HOME EXERCISE PROGRAM: Access Code: [email or web address] URL: [email or web address] Date: 07/07/2024 Prepared by: Lucie Meeter  Exercises - Seated Scapular Retraction  - 1 x daily - 7 x weekly - 2 sets - 10 reps - Putty Squeezes  - 1 x daily - 7 x weekly - 2 sets - 10 reps - Standing Elbow Extension with Self-Anchored Resistance  - 1 x daily - 7 x weekly - 2 sets - 10 reps - Seated Elbow Flexion with Resistance  - 1 x daily - 7 x weekly - 2 sets - 10 reps - Wrist Extension with Resistance  - 1 x daily - 7 x weekly - 2 sets - 10 reps - Wrist  Flexion with Resistance  - 1 x daily - 7 x weekly - 2 sets - 10 reps  ASSESSMENT:  CLINICAL IMPRESSION: Patient is a 63 y.o. female who was seen today for physical therapy evaluation and treatment for spondylosis without myelopathy or radiculopathy, cervical region. Evaluation was limited due to high pain irritability with minimal objective measurements performed. She is noted to have severely limited and painful cervical AROM, painful and limited shoulder AROM, RUE weakness, and postural abnormalities. HEP was issued to begin RUE strengthening with fairly good tolerance. She will benefit from trial of physical therapy to address the above stated deficits in order to optimize her function, however if high pain levels impede her participation in PT will plan to refer back to provider for further assessment and treatment.   OBJECTIVE IMPAIRMENTS: Abnormal gait, decreased activity tolerance, decreased knowledge of condition, decreased ROM, decreased strength, impaired flexibility, impaired sensation, impaired UE functional use, improper body mechanics, postural dysfunction, and pain.   ACTIVITY LIMITATIONS: carrying, lifting, bending, standing, squatting, sleeping, stairs, transfers, bathing, reach over head, hygiene/grooming, and locomotion level  PARTICIPATION LIMITATIONS: meal prep, cleaning, laundry, driving, shopping, community activity, and yard work  PERSONAL FACTORS: Age, Fitness, Time since onset of injury/illness/exacerbation, and 3+ comorbidities: see PMH/PSH above are also affecting patient's functional outcome.   REHAB POTENTIAL: Fair chronicity, co-morbidities  CLINICAL DECISION MAKING: Evolving/moderate complexity  EVALUATION COMPLEXITY: Moderate   GOALS: Goals reviewed with patient? Yes  SHORT TERM GOALS: Target date: 07/28/2024    Patient will be independent and compliant with initial HEP.   Baseline: issued at eval  Goal status: INITIAL  2.  Patient will demonstrate at  least 120 degrees of bilateral shoulder flexion AROM to improve ability to complete reaching activity.  Baseline: see above Goal status: INITIAL  3.  Patient will demonstrate at least 20 degrees of cervical flexion AROM to improve ability to complete reading activity.  Baseline: see above Goal status: INITIAL  4.  Patient will demonstrate at least 20 degrees of cervical rotation AROM to improve ability to scan environment when walking.  Baseline: see above Goal status: INITIAL    LONG TERM GOALS: Target date: 08/22/24  Patient will score </= 58 % disability on the Neck Disability Index (MDC is 20%) to signify clinically meaningful improvement in functional abilities.   Baseline: see above Goal status: INITIAL  2.  Patient will demonstrate at least 20 lbs of Rt grip strength to improve ability to carry items.  Baseline: see above Goal status: INITIAL  3.  Patient will report at least a 50% subjective improvement  in neck/RUE pain to reduce current functional limitations.  Baseline: 0% Goal status: INITIAL  4.  Patient will demonstrate gross 4+/5 RUE strength to improve ability to lift items.  Baseline: see above  Goal status: INITIAL  5.  Patient will be independent with advanced HEP to progress/maintain current level of function.  Baseline: initial HEP issued  Goal status: INITIAL   PLAN:  PT FREQUENCY: 1x/week  PT DURATION: 6 weeks  PLANNED INTERVENTIONS: 97164- PT Re-evaluation, 97750- Physical Performance Testing, 97110-Therapeutic exercises, 97530- Therapeutic activity, [id number]- Neuromuscular re-education, 97535- Self Care, [id number]- Manual therapy, 20560 (1-2 muscles), 20561 (3+ muscles)- Dry Needling, Taping, Cryotherapy, and Moist heat  PLAN FOR NEXT SESSION: cervical ROM to tolerance, postural correctives, RUE strengthening. Modalities for pain control as needed.   Danaysia Rader, PT, DPT, ATC 07/07/24 12:29 PM

## 2024-07-07 ENCOUNTER — Ambulatory Visit: Attending: Neurosurgery

## 2024-07-07 ENCOUNTER — Other Ambulatory Visit: Payer: Self-pay

## 2024-07-07 DIAGNOSIS — R293 Abnormal posture: Secondary | ICD-10-CM | POA: Insufficient documentation

## 2024-07-07 DIAGNOSIS — M47812 Spondylosis without myelopathy or radiculopathy, cervical region: Secondary | ICD-10-CM | POA: Diagnosis not present

## 2024-07-07 DIAGNOSIS — M6281 Muscle weakness (generalized): Secondary | ICD-10-CM | POA: Insufficient documentation

## 2024-07-07 DIAGNOSIS — M542 Cervicalgia: Secondary | ICD-10-CM | POA: Diagnosis not present

## 2024-07-07 DIAGNOSIS — Z6838 Body mass index (BMI) 38.0-38.9, adult: Secondary | ICD-10-CM | POA: Diagnosis not present

## 2024-07-07 DIAGNOSIS — M5440 Lumbago with sciatica, unspecified side: Secondary | ICD-10-CM | POA: Diagnosis not present

## 2024-07-09 DIAGNOSIS — M4712 Other spondylosis with myelopathy, cervical region: Secondary | ICD-10-CM | POA: Diagnosis not present

## 2024-07-09 DIAGNOSIS — M25511 Pain in right shoulder: Secondary | ICD-10-CM | POA: Diagnosis not present

## 2024-07-09 DIAGNOSIS — M25512 Pain in left shoulder: Secondary | ICD-10-CM | POA: Diagnosis not present

## 2024-07-09 DIAGNOSIS — M47816 Spondylosis without myelopathy or radiculopathy, lumbar region: Secondary | ICD-10-CM | POA: Diagnosis not present

## 2024-07-23 DIAGNOSIS — N1831 Chronic kidney disease, stage 3a: Secondary | ICD-10-CM | POA: Diagnosis not present

## 2024-07-23 DIAGNOSIS — E785 Hyperlipidemia, unspecified: Secondary | ICD-10-CM | POA: Diagnosis not present

## 2024-07-23 DIAGNOSIS — I10 Essential (primary) hypertension: Secondary | ICD-10-CM | POA: Diagnosis not present

## 2024-07-30 ENCOUNTER — Ambulatory Visit

## 2024-07-30 DIAGNOSIS — Z1231 Encounter for screening mammogram for malignant neoplasm of breast: Secondary | ICD-10-CM

## 2024-08-03 NOTE — Therapy (Signed)
 OUTPATIENT PHYSICAL THERAPY TREATMENT + DISCHARGE   Patient Name: Brandy Delacruz MRN: [id number] DOB:11/05/1961, 63 y.o., female Today's Date: 08/04/2024   PHYSICAL THERAPY DISCHARGE SUMMARY  Visits from Start of Care: 2  Current functional level related to goals / functional outcomes: Highly limited d/t pain/weakness   Remaining deficits: Pain, weakness, reduced ROM   Education / Equipment: HEP, discharge education, follow up with provider   Patient agrees to discharge. Patient goals were not met. Patient is being discharged due to high symptom irritability and progression of symptoms.   END OF SESSION:  PT End of Session - 08/04/24 1017     Visit Number 2    Number of Visits 7    Date for PT Re-Evaluation 08/22/24    Authorization Type [employer] MCR    Progress Note Due on Visit 10    PT Start Time 1017    PT Stop Time 1055    PT Time Calculation (min) 38 min    Activity Tolerance Patient limited by pain           Past Medical History:  Diagnosis Date   Anxiety    Arthritis    Chronic kidney disease    Depression    GERD (gastroesophageal reflux disease)    Hypercholesteremia    Hypertension    Past Surgical History:  Procedure Laterality Date   ABDOMINAL HYSTERECTOMY     BACK SURGERY     bladder stimulator     EAR MASTOIDECTOMY W/ COCHLEAR IMPLANT W/ LANDMARK     KNEE SURGERY Right    NECK SURGERY     TONSILLECTOMY     There are no active problems to display for this patient.   PCP: Alben Therisa MATSU, PA  REFERRING PROVIDER: Onetha Kuba, MD   REFERRING DIAG: Spondylosis without myelopathy or radiculopathy, cervical region   THERAPY DIAG:  Cervicalgia  Muscle weakness (generalized)  Abnormal posture  Rationale for Evaluation and Treatment: Rehabilitation  ONSET DATE: chronic   SUBJECTIVE:                                                                                                                                                                                                         Per eval: Patient reports worsening of chronic neck pain over the past year. She already knows there is a problem that can only be surgically fixed. She reports having a cervical fusion of C3-6, but now C7-8 are the problem. The pain will course down her shoulder blade and into her Rt arm to digits 4-5 described  as burning. Insurance denied recent MRI request and was instructed that she has to try PT. She has to sleep with the neck upright due to pain.  Hand dominance: Right  SUBJECTIVE STATEMENT: 08/04/2024: states symptoms seem to be worsening, feels like she is having more shoulder pain now. States she is dropping things a lot. States she has tried to do HEP without much effect.   PERTINENT HISTORY:  Neck surgery 2017 (per patient C3-6 fusion) Back surgery 2014 (per patient laminectomy)  Arthritis  Chronic kidney disease   PAIN:  Are you having pain? Yes: NPRS scale: 7-8 Pain location: neck and Rt upper arm Pain description: continuous burn Aggravating factors: lifting, neck extension Relieving factors: medication   PRECAUTIONS: None  RED FLAGS: None     WEIGHT BEARING RESTRICTIONS: No  FALLS:  Has patient fallen in last 6 months? No  LIVING ENVIRONMENT: Lives with: lives with their family Lives in: House/apartment Stairs: Yes: Internal: flight steps; on left going up Has following equipment at home: None  OCCUPATION: on disability   PLOF: Needs assistance with ADLs and Needs assistance with homemaking  PATIENT GOALS: have surgery and get over the pain.   NEXT MD VISIT: TBD per pt report for neurosurgery; pain management 08/10/24 per pt  OBJECTIVE:  Note: Objective measures were completed at Evaluation unless otherwise noted.  DIAGNOSTIC FINDINGS:  No recent imaging on file   PATIENT SURVEYS:  NDI: 39/50; 78% disability  08/04/24 NDI: 42/50; 82%   COGNITION: Overall cognitive status: Within functional limits  for tasks assessed  SENSATION: Light touch diminished with all dermatomes of RUE   POSTURE: Rt lateral shift in standing, forward head, rounded shoulders   PALPATION: Not assessed    CERVICAL ROM:   Active ROM A/PROM (deg) eval 08/04/24    Flexion 12 NT given irritability  Extension 10 NT given irritability  Right lateral flexion    Left lateral flexion    Right rotation 12 20  Left rotation 12 24   (Blank rows = not tested) pain with all cervical AROM  UPPER EXTREMITY ROM:  Active ROM Right eval Left eval R/L 08/04/24   Shoulder flexion 90 pain 90 pain 88 deg * / 108 deg   Shoulder extension     Shoulder abduction     Shoulder adduction     Shoulder extension     Shoulder internal rotation     Shoulder external rotation     Elbow flexion     Elbow extension     Wrist flexion     Wrist extension     Wrist ulnar deviation     Wrist radial deviation     Wrist pronation     Wrist supination      (Blank rows = not tested)  UPPER EXTREMITY MMT:  MMT Right eval Left eval R/L 08/04/24   Shoulder flexion 3- 3- 3-/3-  Shoulder extension     Shoulder abduction     Shoulder adduction     Shoulder extension     Shoulder internal rotation     Shoulder external rotation     Middle trapezius     Lower trapezius     Elbow flexion 4- 5 3+/5  Elbow extension 4- 5 3+/5  Wrist flexion 4 5 4/5  Wrist extension 4 4+ 4/5  Wrist ulnar deviation     Wrist radial deviation     Wrist pronation     Wrist supination     Grip strength  10 30 15#/35#   (Blank rows = not tested)  CERVICAL SPECIAL TESTS:  Not tested   FUNCTIONAL TESTS:  Not tested    [facility] Adult PT Treatment:                                                DATE: 08/04/24 Therapeutic Exercise: HEP discussion/education, discussed regression/progression based on symptom response, rationale for interventions, pt politely declines need for new handout  Therapeutic Activity: MSK assessment + education (increased  time given symptom irritability) NDI + education Education/discussion re: progress with PT, symptom behavior as it affects activity tolerance, PT goals/POC, discharge education, monitoring symptoms and communication w/ provider   [facility] Adult PT Treatment:                                                DATE: 07/07/24 Therapeutic Exercise: Demonstrated,performed, and issued initial HEP.     PATIENT EDUCATION:  Education details: PT POC, PT goals, progress with PT thus far, discharge planning, HEP, follow up with provider Person educated: Patient Education method: Explanation, Demonstration, Verbal cues Education comprehension: verbalized understanding, returned demonstration   HOME EXERCISE PROGRAM: Access Code: [email or web address] URL: [email or web address] Date: 07/07/2024 Prepared by: Lucie Meeter  Exercises - Seated Scapular Retraction  - 1 x daily - 7 x weekly - 2 sets - 10 reps - Putty Squeezes  - 1 x daily - 7 x weekly - 2 sets - 10 reps - Standing Elbow Extension with Self-Anchored Resistance  - 1 x daily - 7 x weekly - 2 sets - 10 reps - Seated Elbow Flexion with Resistance  - 1 x daily - 7 x weekly - 2 sets - 10 reps - Wrist Extension with Resistance  - 1 x daily - 7 x weekly - 2 sets - 10 reps - Wrist Flexion with Resistance  - 1 x daily - 7 x weekly - 2 sets - 10 reps  ASSESSMENT:  CLINICAL IMPRESSION: 08/04/2024: Pt arrives w/ report of continued worsening of symptoms since initial eval, has performed HEP with limited effect. Her MSK exam remains highly provocative today and requires frequent rest breaks. Continues to demonstrate UE weakness and limitations in cervical ROM. At this point, appears to have limited rehab potential given high symptom irritability limiting participation in active interventions. At this point recommend discharge to independent HEP and additional follow up with provider - pt states she will reach out to them after session. No adverse events,  increased time w/ exam today given symptom irritability. Pt departs today's session in no acute distress, all voiced questions/concerns addressed appropriately from PT perspective.    Per eval: Patient is a 63 y.o. female who was seen today for physical therapy evaluation and treatment for spondylosis without myelopathy or radiculopathy, cervical region. Evaluation was limited due to high pain irritability with minimal objective measurements performed. She is noted to have severely limited and painful cervical AROM, painful and limited shoulder AROM, RUE weakness, and postural abnormalities. HEP was issued to begin RUE strengthening with fairly good tolerance. She will benefit from trial of physical therapy to address the above stated deficits in order to optimize her function, however if high pain levels impede her participation in PT will  plan to refer back to provider for further assessment and treatment.   OBJECTIVE IMPAIRMENTS: Abnormal gait, decreased activity tolerance, decreased knowledge of condition, decreased ROM, decreased strength, impaired flexibility, impaired sensation, impaired UE functional use, improper body mechanics, postural dysfunction, and pain.   ACTIVITY LIMITATIONS: carrying, lifting, bending, standing, squatting, sleeping, stairs, transfers, bathing, reach over head, hygiene/grooming, and locomotion level  PARTICIPATION LIMITATIONS: meal prep, cleaning, laundry, driving, shopping, community activity, and yard work  PERSONAL FACTORS: Age, Fitness, Time since onset of injury/illness/exacerbation, and 3+ comorbidities: see PMH/PSH above are also affecting patient's functional outcome.   REHAB POTENTIAL: Fair chronicity, co-morbidities  CLINICAL DECISION MAKING: Evolving/moderate complexity  EVALUATION COMPLEXITY: Moderate   GOALS: Goals reviewed with patient? Yes  SHORT TERM GOALS: Target date: 07/28/2024    Patient will be independent and compliant with initial HEP.    Baseline: issued at eval  08/04/24: reports good performance w/ variable tolerance Goal status: MET  2.  Patient will demonstrate at least 120 degrees of bilateral shoulder flexion AROM to improve ability to complete reaching activity.  Baseline: see above 08/04/24: see above Goal status: NOT MET  3.  Patient will demonstrate at least 20 degrees of cervical flexion AROM to improve ability to complete reading activity.  Baseline: see above 08/04/24: deferred given symptom irritability Goal status: NOT MET  4.  Patient will demonstrate at least 20 degrees of cervical rotation AROM to improve ability to scan environment when walking.  Baseline: see above 08/04/24: met, highly painful Goal status: MET    LONG TERM GOALS: Target date: 08/22/24  Patient will score </= 58 % disability on the Neck Disability Index (MDC is 20%) to signify clinically meaningful improvement in functional abilities.   Baseline: see above 08/04/24: 82% Goal status: NOT MET  2.  Patient will demonstrate at least 20 lbs of Rt grip strength to improve ability to carry items.  Baseline: see above 08/04/24: 15# Goal status: NOT MET  3.  Patient will report at least a 50% subjective improvement in neck/RUE pain to reduce current functional limitations.  Baseline: 0% 08/04/24: worsening Goal status: NOT MET  4.  Patient will demonstrate gross 4+/5 RUE strength to improve ability to lift items.  Baseline: see above  08/04/24: limited by pain Goal status: NOT MET  5.  Patient will be independent with advanced HEP to progress/maintain current level of function.  Baseline: initial HEP issued  08/04/24: initial HEP only Goal status: NOT MET   PLAN: DISCHARGE 08/04/24   Alm DELENA Jenny PT, DPT 08/04/2024 3:44 PM

## 2024-08-04 ENCOUNTER — Encounter: Payer: Self-pay | Admitting: Physical Therapy

## 2024-08-04 ENCOUNTER — Ambulatory Visit: Payer: Self-pay | Attending: Neurosurgery | Admitting: Physical Therapy

## 2024-08-04 DIAGNOSIS — R293 Abnormal posture: Secondary | ICD-10-CM | POA: Diagnosis not present

## 2024-08-04 DIAGNOSIS — M6281 Muscle weakness (generalized): Secondary | ICD-10-CM | POA: Insufficient documentation

## 2024-08-04 DIAGNOSIS — M542 Cervicalgia: Secondary | ICD-10-CM | POA: Insufficient documentation

## 2024-08-10 DIAGNOSIS — M47816 Spondylosis without myelopathy or radiculopathy, lumbar region: Secondary | ICD-10-CM | POA: Diagnosis not present

## 2024-08-10 DIAGNOSIS — M4712 Other spondylosis with myelopathy, cervical region: Secondary | ICD-10-CM | POA: Diagnosis not present

## 2024-08-10 DIAGNOSIS — M25512 Pain in left shoulder: Secondary | ICD-10-CM | POA: Diagnosis not present

## 2024-08-10 DIAGNOSIS — M25511 Pain in right shoulder: Secondary | ICD-10-CM | POA: Diagnosis not present

## 2024-08-18 DIAGNOSIS — M542 Cervicalgia: Secondary | ICD-10-CM | POA: Diagnosis not present

## 2024-08-23 DIAGNOSIS — E785 Hyperlipidemia, unspecified: Secondary | ICD-10-CM | POA: Diagnosis not present

## 2024-08-23 DIAGNOSIS — N1831 Chronic kidney disease, stage 3a: Secondary | ICD-10-CM | POA: Diagnosis not present

## 2024-08-23 DIAGNOSIS — I10 Essential (primary) hypertension: Secondary | ICD-10-CM | POA: Diagnosis not present

## 2024-08-27 DIAGNOSIS — M542 Cervicalgia: Secondary | ICD-10-CM | POA: Diagnosis not present

## 2024-08-27 DIAGNOSIS — Z6839 Body mass index (BMI) 39.0-39.9, adult: Secondary | ICD-10-CM | POA: Diagnosis not present

## 2024-08-27 DIAGNOSIS — M5440 Lumbago with sciatica, unspecified side: Secondary | ICD-10-CM | POA: Diagnosis not present

## 2024-09-09 DIAGNOSIS — E66812 Obesity, class 2: Secondary | ICD-10-CM | POA: Diagnosis not present

## 2024-09-09 DIAGNOSIS — E785 Hyperlipidemia, unspecified: Secondary | ICD-10-CM | POA: Diagnosis not present

## 2024-09-09 DIAGNOSIS — N1831 Chronic kidney disease, stage 3a: Secondary | ICD-10-CM | POA: Diagnosis not present

## 2024-09-09 DIAGNOSIS — Z6835 Body mass index (BMI) 35.0-35.9, adult: Secondary | ICD-10-CM | POA: Diagnosis not present

## 2024-09-16 DIAGNOSIS — M4712 Other spondylosis with myelopathy, cervical region: Secondary | ICD-10-CM | POA: Diagnosis not present

## 2024-09-16 DIAGNOSIS — M25511 Pain in right shoulder: Secondary | ICD-10-CM | POA: Diagnosis not present

## 2024-09-16 DIAGNOSIS — M25512 Pain in left shoulder: Secondary | ICD-10-CM | POA: Diagnosis not present

## 2024-09-16 DIAGNOSIS — M47816 Spondylosis without myelopathy or radiculopathy, lumbar region: Secondary | ICD-10-CM | POA: Diagnosis not present

## 2024-09-22 DIAGNOSIS — Z23 Encounter for immunization: Secondary | ICD-10-CM | POA: Diagnosis not present

## 2024-09-22 DIAGNOSIS — E785 Hyperlipidemia, unspecified: Secondary | ICD-10-CM | POA: Diagnosis not present

## 2024-09-22 DIAGNOSIS — M25511 Pain in right shoulder: Secondary | ICD-10-CM | POA: Diagnosis not present

## 2024-09-22 DIAGNOSIS — S41132A Puncture wound without foreign body of left upper arm, initial encounter: Secondary | ICD-10-CM | POA: Diagnosis not present

## 2024-09-22 DIAGNOSIS — W540XXA Bitten by dog, initial encounter: Secondary | ICD-10-CM | POA: Diagnosis not present

## 2024-09-22 DIAGNOSIS — I10 Essential (primary) hypertension: Secondary | ICD-10-CM | POA: Diagnosis not present

## 2024-09-22 DIAGNOSIS — N1831 Chronic kidney disease, stage 3a: Secondary | ICD-10-CM | POA: Diagnosis not present

## 2024-10-15 DIAGNOSIS — M67813 Other specified disorders of tendon, right shoulder: Secondary | ICD-10-CM | POA: Diagnosis not present

## 2024-10-15 DIAGNOSIS — M5412 Radiculopathy, cervical region: Secondary | ICD-10-CM | POA: Diagnosis not present

## 2024-10-23 DIAGNOSIS — E785 Hyperlipidemia, unspecified: Secondary | ICD-10-CM | POA: Diagnosis not present

## 2024-10-23 DIAGNOSIS — I10 Essential (primary) hypertension: Secondary | ICD-10-CM | POA: Diagnosis not present

## 2024-10-23 DIAGNOSIS — N1831 Chronic kidney disease, stage 3a: Secondary | ICD-10-CM | POA: Diagnosis not present

## 2024-11-04 DIAGNOSIS — M79601 Pain in right arm: Secondary | ICD-10-CM | POA: Diagnosis not present

## 2024-11-04 DIAGNOSIS — S199XXA Unspecified injury of neck, initial encounter: Secondary | ICD-10-CM | POA: Diagnosis not present

## 2024-11-04 DIAGNOSIS — W19XXXA Unspecified fall, initial encounter: Secondary | ICD-10-CM | POA: Diagnosis not present

## 2024-11-04 DIAGNOSIS — R4182 Altered mental status, unspecified: Secondary | ICD-10-CM | POA: Diagnosis not present

## 2024-11-04 DIAGNOSIS — M5412 Radiculopathy, cervical region: Secondary | ICD-10-CM | POA: Diagnosis not present

## 2024-11-04 DIAGNOSIS — S0990XA Unspecified injury of head, initial encounter: Secondary | ICD-10-CM | POA: Diagnosis not present

## 2024-11-04 DIAGNOSIS — M25511 Pain in right shoulder: Secondary | ICD-10-CM | POA: Diagnosis not present

## 2024-11-04 DIAGNOSIS — M47816 Spondylosis without myelopathy or radiculopathy, lumbar region: Secondary | ICD-10-CM | POA: Diagnosis not present

## 2024-11-04 DIAGNOSIS — S40021A Contusion of right upper arm, initial encounter: Secondary | ICD-10-CM | POA: Diagnosis not present

## 2024-11-04 DIAGNOSIS — Y93E8 Activity, other personal hygiene: Secondary | ICD-10-CM | POA: Diagnosis not present

## 2024-11-04 DIAGNOSIS — W228XXA Striking against or struck by other objects, initial encounter: Secondary | ICD-10-CM | POA: Diagnosis not present

## 2024-11-06 DIAGNOSIS — S0093XA Contusion of unspecified part of head, initial encounter: Secondary | ICD-10-CM | POA: Diagnosis not present

## 2024-11-06 DIAGNOSIS — S40021A Contusion of right upper arm, initial encounter: Secondary | ICD-10-CM | POA: Diagnosis not present

## 2024-11-06 DIAGNOSIS — W19XXXA Unspecified fall, initial encounter: Secondary | ICD-10-CM | POA: Diagnosis not present

## 2024-11-11 DIAGNOSIS — Z01818 Encounter for other preprocedural examination: Secondary | ICD-10-CM | POA: Diagnosis not present

## 2024-11-11 DIAGNOSIS — M4712 Other spondylosis with myelopathy, cervical region: Secondary | ICD-10-CM | POA: Diagnosis not present

## 2024-11-11 DIAGNOSIS — M47816 Spondylosis without myelopathy or radiculopathy, lumbar region: Secondary | ICD-10-CM | POA: Diagnosis not present

## 2024-11-11 DIAGNOSIS — G894 Chronic pain syndrome: Secondary | ICD-10-CM | POA: Diagnosis not present

## 2024-11-11 DIAGNOSIS — M25511 Pain in right shoulder: Secondary | ICD-10-CM | POA: Diagnosis not present

## 2024-11-22 DIAGNOSIS — I10 Essential (primary) hypertension: Secondary | ICD-10-CM | POA: Diagnosis not present

## 2024-11-22 DIAGNOSIS — N1831 Chronic kidney disease, stage 3a: Secondary | ICD-10-CM | POA: Diagnosis not present

## 2024-11-22 DIAGNOSIS — E785 Hyperlipidemia, unspecified: Secondary | ICD-10-CM | POA: Diagnosis not present

## 2024-11-25 ENCOUNTER — Other Ambulatory Visit: Payer: Self-pay | Admitting: Family Medicine

## 2024-11-25 DIAGNOSIS — E2839 Other primary ovarian failure: Secondary | ICD-10-CM

## 2024-11-25 DIAGNOSIS — E559 Vitamin D deficiency, unspecified: Secondary | ICD-10-CM | POA: Diagnosis not present

## 2024-11-25 DIAGNOSIS — E7800 Pure hypercholesterolemia, unspecified: Secondary | ICD-10-CM | POA: Diagnosis not present

## 2024-11-25 DIAGNOSIS — Z780 Asymptomatic menopausal state: Secondary | ICD-10-CM

## 2024-11-25 DIAGNOSIS — K219 Gastro-esophageal reflux disease without esophagitis: Secondary | ICD-10-CM | POA: Diagnosis not present

## 2024-12-02 ENCOUNTER — Ambulatory Visit: Payer: Self-pay

## 2024-12-02 DIAGNOSIS — M5010 Cervical disc disorder with radiculopathy, unspecified cervical region: Secondary | ICD-10-CM

## 2024-12-02 MED ORDER — TRANEXAMIC ACID 1000 MG/10ML IV SOLN: 1000.0000 mg | INTRAVENOUS | Status: AC

## 2024-12-02 NOTE — Progress Notes (Signed)
 Surgical Instructions   Your procedure is scheduled on December 08, 2024. Report to [facility] Main Entrance A at 7:30 A.M., then check in with the Admitting office. Any questions or running late day of surgery: call [phone]  Questions prior to your surgery date: call [phone], Monday-Friday, 8am-4pm. If you experience any cold or flu symptoms such as cough, fever, chills, shortness of breath, etc. between now and your scheduled surgery, please notify us  at the above number.     Remember:  Do not eat after midnight the night before your surgery  You may drink clear liquids until 6:30 the morning of your surgery.   Clear liquids allowed are: Water, Non-Citrus Juices (without pulp), Carbonated Beverages, Clear Tea (no milk, honey, etc.), Black Coffee Only (NO MILK, CREAM OR POWDERED CREAMER of any kind), and Gatorade.    Take these medicines the morning of surgery with A SIP OF WATER  clonazePAM (KLONOPIN)  famotidine  (PEPCID )  LYRICA  morphine (MS CONTIN)  omeprazole (PRILOSEC)   May take these medicines IF NEEDED: tiZANidine (ZANAFLEX)    One week prior to surgery, STOP taking any Aspirin (unless otherwise instructed by your surgeon) Aleve, Naproxen, Ibuprofen, Motrin, Advil, Goody's, BC's, all herbal medications, fish oil, and non-prescription vitamins.                     Do NOT Smoke (Tobacco/Vaping) for 24 hours prior to your procedure.  If you use a CPAP at night, you may bring your mask/headgear for your overnight stay.   You will be asked to remove any contacts, glasses, piercing's, hearing aid's, dentures/partials prior to surgery. Please bring cases for these items if needed.    Patients discharged the day of surgery will not be allowed to drive home, and someone needs to stay with them for 24 hours.  SURGICAL WAITING ROOM VISITATION Patients may have no more than 2 support people in the waiting area - these visitors may rotate.   Pre-op nurse will  coordinate an appropriate time for 1 ADULT support person, who may not rotate, to accompany patient in pre-op.  Children under the age of 34 must have an adult with them who is not the patient and must remain in the main waiting area with an adult.  If the patient needs to stay at the hospital during part of their recovery, the visitor guidelines for inpatient rooms apply.  Please refer to the [facility] website for the visitor guidelines for any additional information.   If you received a COVID test during your pre-op visit  it is requested that you wear a mask when out in public, stay away from anyone that may not be feeling well and notify your surgeon if you develop symptoms. If you have been in contact with anyone that has tested positive in the last 10 days please notify you surgeon.      Pre-operative 4 CHG Bathing Instructions   You can play a key role in reducing the risk of infection after surgery. Your skin needs to be as free of germs as possible. You can reduce the number of germs on your skin by washing with CHG (chlorhexidine  gluconate) soap before surgery. CHG is an antiseptic soap that kills germs and continues to kill germs even after washing.   DO NOT use if you have an allergy to chlorhexidine /CHG or antibacterial soaps. If your skin becomes reddened or irritated, stop using the CHG and notify one of our RNs at [phone].  Please shower with the CHG soap starting 4 days before surgery using the following schedule:     Please keep in mind the following:  DO NOT shave, including legs and underarms, starting the day of your first shower.   You may shave your face at any point before/day of surgery.  Place clean sheets on your bed the day you start using CHG soap. Use a clean washcloth (not used since being washed) for each shower. DO NOT sleep with pets once you start using the CHG.   CHG Shower Instructions:  Wash your face and private area with normal soap. If  you choose to wash your hair, wash first with your normal shampoo.  After you use shampoo/soap, rinse your hair and body thoroughly to remove shampoo/soap residue.  Turn the water OFF and apply  bottle of CHG soap to a CLEAN washcloth.  Apply CHG soap ONLY FROM YOUR NECK DOWN TO YOUR TOES (washing for 3-5 minutes)  DO NOT use CHG soap on face, private areas, open wounds, or sores.  Pay special attention to the area where your surgery is being performed.  If you are having back surgery, having someone wash your back for you may be helpful. Wait 2 minutes after CHG soap is applied, then you may rinse off the CHG soap.  Pat dry with a clean towel  Put on clean clothes/pajamas   If you choose to wear lotion, please use ONLY the CHG-compatible lotions that are listed below.  Additional instructions for the day of surgery:  If you choose, you may shower the morning of surgery with an antibacterial soap.  DO NOT APPLY any lotions, deodorants, cologne, or perfumes.   Do not bring valuables to the hospital. [facility] is not responsible for any belongings/valuables. Do not wear nail polish, gel polish, artificial nails, or any other type of covering on natural nails (fingers and toes) Do not wear jewelry or makeup Put on clean/comfortable clothes.  Please brush your teeth.  Ask your nurse before applying any prescription medications to the skin.     CHG Compatible Lotions   Aveeno Moisturizing lotion  Cetaphil Moisturizing Cream  Cetaphil Moisturizing Lotion  Clairol Herbal Essence Moisturizing Lotion, Dry Skin  Clairol Herbal Essence Moisturizing Lotion, Extra Dry Skin  Clairol Herbal Essence Moisturizing Lotion, Normal Skin  Curel Age Defying Therapeutic Moisturizing Lotion with Alpha Hydroxy  Curel Extreme Care Body Lotion  Curel Soothing Hands Moisturizing Hand Lotion  Curel Therapeutic Moisturizing Cream, Fragrance-Free  Curel Therapeutic Moisturizing Lotion, Fragrance-Free   Curel Therapeutic Moisturizing Lotion, Original Formula  Eucerin Daily Replenishing Lotion  Eucerin Dry Skin Therapy Plus Alpha Hydroxy Crme  Eucerin Dry Skin Therapy Plus Alpha Hydroxy Lotion  Eucerin Original Crme  Eucerin Original Lotion  Eucerin Plus Crme Eucerin Plus Lotion  Eucerin TriLipid Replenishing Lotion  Keri Anti-Bacterial Hand Lotion  Keri Deep Conditioning Original Lotion Dry Skin Formula Softly Scented  Keri Deep Conditioning Original Lotion, Fragrance Free Sensitive Skin Formula  Keri Lotion Fast Absorbing Fragrance Free Sensitive Skin Formula  Keri Lotion Fast Absorbing Softly Scented Dry Skin Formula  Keri Original Lotion  Keri Skin Renewal Lotion Keri Silky Smooth Lotion  Keri Silky Smooth Sensitive Skin Lotion  Nivea Body Creamy Conditioning Oil  Nivea Body Extra Enriched Teacher, Adult Education Moisturizing Lotion Nivea Crme  Nivea Skin Firming Lotion  NutraDerm 30 Skin Lotion  NutraDerm Skin Lotion  NutraDerm Therapeutic Skin Cream  NutraDerm Therapeutic Skin  Lotion  ProShield Protective Hand Cream  Provon moisturizing lotion  Please read over the following fact sheets that you were given.

## 2024-12-03 ENCOUNTER — Other Ambulatory Visit: Payer: Self-pay

## 2024-12-03 ENCOUNTER — Inpatient Hospital Stay (HOSPITAL_COMMUNITY): Admission: RE | Admit: 2024-12-03 | Discharge: 2024-12-03

## 2024-12-03 ENCOUNTER — Encounter (HOSPITAL_COMMUNITY): Payer: Self-pay

## 2024-12-03 VITALS — BP 113/82 | HR 57 | Temp 97.9°F | Resp 18 | Ht 61.0 in | Wt 199.7 lb

## 2024-12-03 DIAGNOSIS — M5010 Cervical disc disorder with radiculopathy, unspecified cervical region: Secondary | ICD-10-CM

## 2024-12-03 DIAGNOSIS — Z01818 Encounter for other preprocedural examination: Secondary | ICD-10-CM

## 2024-12-03 LAB — BASIC METABOLIC PANEL WITH GFR
Anion gap: 5 (ref 5–15)
BUN: 22 mg/dL (ref 8–23)
CO2: 32 mmol/L (ref 22–32)
Calcium: 9.2 mg/dL (ref 8.9–10.3)
Chloride: 103 mmol/L (ref 98–111)
Creatinine, Ser: 1.2 mg/dL — ABNORMAL HIGH (ref 0.44–1.00)
GFR, Estimated: 52 mL/min — ABNORMAL LOW (ref >60)
Glucose, Bld: 95 mg/dL (ref 70–99)
Potassium: 4.9 mmol/L (ref 3.5–5.1)
Sodium: 140 mmol/L (ref 135–145)

## 2024-12-03 LAB — CBC
HCT: 38.6 % (ref 36.0–46.0)
Hemoglobin: 12.2 g/dL (ref 12.0–15.0)
MCH: 31.9 pg (ref 26.0–34.0)
MCHC: 31.6 g/dL (ref 30.0–36.0)
MCV: 101 fL — ABNORMAL HIGH (ref 80.0–100.0)
Platelets: 202 K/uL (ref 150–400)
RBC: 3.82 MIL/uL — ABNORMAL LOW (ref 3.87–5.11)
RDW: 12.4 % (ref 11.5–15.5)
WBC: 5.2 K/uL (ref 4.0–10.5)
nRBC: 0 % (ref 0.0–0.2)

## 2024-12-03 LAB — TYPE AND SCREEN
ABO/RH(D): O POS
Antibody Screen: NEGATIVE

## 2024-12-03 LAB — SURGICAL PCR SCREEN
MRSA, PCR: NEGATIVE
Staphylococcus aureus: NEGATIVE

## 2024-12-03 NOTE — Progress Notes (Signed)
 PCP - Therisa Holm Cardiologist - Denies  PPM/ICD - Denies Device Orders - n/a Rep Notified - n/a  Bladder Stimulator - instructed to bring remote control with.  Chest x-ray - n/a EKG - 12-03-24 Stress Test - denies ECHO - 10-03-18 CE Cardiac Cath - Denies  Sleep Study - Denies CPAP - n/a  NON-diabetic  Last dose of GLP1 agonist-  denies GLP1 instructions: n/a  Blood Thinner Instructions: Denies Aspirin Instructions:Denies  ERAS Protcol - clears  until 0630 PRE-SURGERY Ensure or G2- none  COVID TEST- n/a   Anesthesia review: yes, HTN, EKG tracing from 11-11-24 requested from [facility]   Patient denies shortness of breath, fever, cough and chest pain at PAT appointment   All instructions explained to the patient, with a verbal understanding of the material. Patient agrees to go over the instructions while at home for a better understanding. Patient also instructed to self quarantine after being tested for COVID-19. The opportunity to ask questions was provided.

## 2024-12-04 ENCOUNTER — Encounter (HOSPITAL_COMMUNITY): Payer: Self-pay

## 2024-12-08 ENCOUNTER — Encounter (HOSPITAL_COMMUNITY): Payer: Self-pay

## 2024-12-08 ENCOUNTER — Ambulatory Visit (HOSPITAL_COMMUNITY): Payer: Self-pay | Admitting: Certified Registered Nurse Anesthetist

## 2024-12-08 ENCOUNTER — Inpatient Hospital Stay (HOSPITAL_COMMUNITY): Admission: AD | Admit: 2024-12-08 | Discharge: 2024-12-11 | DRG: 074 | Disposition: A | Discharge status: Home Health

## 2024-12-08 ENCOUNTER — Ambulatory Visit (HOSPITAL_COMMUNITY)

## 2024-12-08 ENCOUNTER — Encounter (HOSPITAL_COMMUNITY): Admission: RE | Disposition: A | Payer: Self-pay | Source: Home / Self Care

## 2024-12-08 ENCOUNTER — Inpatient Hospital Stay (HOSPITAL_COMMUNITY): Payer: Self-pay | Admitting: Physician Assistant

## 2024-12-08 ENCOUNTER — Other Ambulatory Visit: Payer: Self-pay

## 2024-12-08 DIAGNOSIS — K219 Gastro-esophageal reflux disease without esophagitis: Secondary | ICD-10-CM | POA: Diagnosis present

## 2024-12-08 DIAGNOSIS — Z981 Arthrodesis status: Secondary | ICD-10-CM | POA: Diagnosis not present

## 2024-12-08 DIAGNOSIS — Z886 Allergy status to analgesic agent status: Secondary | ICD-10-CM

## 2024-12-08 DIAGNOSIS — F418 Other specified anxiety disorders: Secondary | ICD-10-CM | POA: Diagnosis not present

## 2024-12-08 DIAGNOSIS — Z823 Family history of stroke: Secondary | ICD-10-CM

## 2024-12-08 DIAGNOSIS — Z833 Family history of diabetes mellitus: Secondary | ICD-10-CM

## 2024-12-08 DIAGNOSIS — M48061 Spinal stenosis, lumbar region without neurogenic claudication: Secondary | ICD-10-CM | POA: Diagnosis not present

## 2024-12-08 DIAGNOSIS — M5412 Radiculopathy, cervical region: Principal | ICD-10-CM | POA: Diagnosis present

## 2024-12-08 DIAGNOSIS — Z87891 Personal history of nicotine dependence: Secondary | ICD-10-CM

## 2024-12-08 DIAGNOSIS — Z6837 Body mass index (BMI) 37.0-37.9, adult: Secondary | ICD-10-CM

## 2024-12-08 DIAGNOSIS — E7800 Pure hypercholesterolemia, unspecified: Secondary | ICD-10-CM | POA: Diagnosis present

## 2024-12-08 DIAGNOSIS — R531 Weakness: Secondary | ICD-10-CM | POA: Diagnosis present

## 2024-12-08 DIAGNOSIS — I10 Essential (primary) hypertension: Secondary | ICD-10-CM | POA: Diagnosis present

## 2024-12-08 DIAGNOSIS — Z79899 Other long term (current) drug therapy: Secondary | ICD-10-CM

## 2024-12-08 DIAGNOSIS — T380X5A Adverse effect of glucocorticoids and synthetic analogues, initial encounter: Secondary | ICD-10-CM | POA: Diagnosis not present

## 2024-12-08 DIAGNOSIS — E66813 Obesity, class 3: Secondary | ICD-10-CM | POA: Diagnosis present

## 2024-12-08 DIAGNOSIS — R739 Hyperglycemia, unspecified: Secondary | ICD-10-CM | POA: Diagnosis not present

## 2024-12-08 HISTORY — PX: ANTERIOR CERVICAL DECOMP/DISCECTOMY FUSION: SHX1161

## 2024-12-08 LAB — ABO/RH: ABO/RH(D): O POS

## 2024-12-08 SURGERY — ANTERIOR CERVICAL DECOMPRESSION/DISCECTOMY FUSION 1 LEVEL
Anesthesia: General | Site: Spine Cervical

## 2024-12-08 MED ORDER — HYDROMORPHONE HCL 1 MG/ML IJ SOLN
INTRAMUSCULAR | Status: AC
  Filled 2024-12-08: qty 1

## 2024-12-08 MED ORDER — CYCLOBENZAPRINE HCL 10 MG PO TABS
10.0000 mg | ORAL_TABLET | Freq: Three times a day (TID) | ORAL | Status: DC | PRN
  Administered 2024-12-08: 15:00:00 10 mg via ORAL

## 2024-12-08 MED ORDER — THROMBIN 20000 UNITS EX SOLR
CUTANEOUS | Status: AC
  Filled 2024-12-08: qty 20000

## 2024-12-08 MED ORDER — OXYCODONE HCL 5 MG PO TABS
10.0000 mg | ORAL_TABLET | ORAL | Status: DC | PRN
  Administered 2024-12-11: 10 mg via ORAL
  Filled 2024-12-08: qty 2

## 2024-12-08 MED ORDER — FLEET ENEMA RE ENEM: 1.0000 | ENEMA | Freq: Once | RECTAL | Status: DC | PRN

## 2024-12-08 MED ORDER — BUPIVACAINE-EPINEPHRINE (PF) 0.25% -1:200000 IJ SOLN
INTRAMUSCULAR | Status: AC
  Filled 2024-12-08: qty 30

## 2024-12-08 MED ORDER — FAMOTIDINE 20 MG PO TABS
20.0000 mg | ORAL_TABLET | Freq: Two times a day (BID) | ORAL | Status: DC
  Administered 2024-12-08 – 2024-12-11 (×6): 20 mg via ORAL
  Filled 2024-12-08 (×6): qty 1

## 2024-12-08 MED ORDER — TIZANIDINE HCL 4 MG PO TABS
6.0000 mg | ORAL_TABLET | Freq: Three times a day (TID) | ORAL | Status: DC | PRN
  Filled 2024-12-08 (×2): qty 1

## 2024-12-08 MED ORDER — MENTHOL 3 MG MT LOZG: 1.0000 | LOZENGE | OROMUCOSAL | Status: DC | PRN

## 2024-12-08 MED ORDER — EPHEDRINE SULFATE-NACL 50-0.9 MG/10ML-% IV SOSY
PREFILLED_SYRINGE | INTRAVENOUS | Status: DC | PRN
  Administered 2024-12-08 (×2): 5 mg via INTRAVENOUS

## 2024-12-08 MED ORDER — KETAMINE HCL 50 MG/5ML IJ SOSY
PREFILLED_SYRINGE | INTRAMUSCULAR | Status: AC
  Filled 2024-12-08: qty 5

## 2024-12-08 MED ORDER — SODIUM CHLORIDE 0.9 % IV SOLN: INTRAVENOUS | Status: AC

## 2024-12-08 MED ORDER — KETOROLAC TROMETHAMINE 30 MG/ML IJ SOLN
INTRAMUSCULAR | Status: AC
  Filled 2024-12-08: qty 1

## 2024-12-08 MED ORDER — LIDOCAINE 2% (20 MG/ML) 5 ML SYRINGE
INTRAMUSCULAR | Status: DC | PRN
  Administered 2024-12-08: 10:00:00 60 mg via INTRAVENOUS

## 2024-12-08 MED ORDER — DEXAMETHASONE SOD PHOSPHATE PF 10 MG/ML IJ SOLN
INTRAMUSCULAR | Status: DC | PRN
  Administered 2024-12-08: 11:00:00 10 mg via INTRAVENOUS

## 2024-12-08 MED ORDER — ROCURONIUM BROMIDE 10 MG/ML (PF) SYRINGE
PREFILLED_SYRINGE | INTRAVENOUS | Status: AC
  Filled 2024-12-08: qty 20

## 2024-12-08 MED ORDER — BISACODYL 5 MG PO TBEC: 5.0000 mg | DELAYED_RELEASE_TABLET | Freq: Every day | ORAL | Status: DC | PRN

## 2024-12-08 MED ORDER — KETAMINE HCL 10 MG/ML IJ SOLN
INTRAMUSCULAR | Status: DC | PRN
  Administered 2024-12-08: 11:00:00 10 mg via INTRAVENOUS
  Administered 2024-12-08: 10:00:00 40 mg via INTRAVENOUS

## 2024-12-08 MED ORDER — HYDROMORPHONE HCL 1 MG/ML IJ SOLN: 1.0000 mg | INTRAMUSCULAR | Status: DC | PRN

## 2024-12-08 MED ORDER — ONDANSETRON HCL 4 MG/2ML IJ SOLN
INTRAMUSCULAR | Status: DC | PRN
  Administered 2024-12-08: 11:00:00 4 mg via INTRAVENOUS

## 2024-12-08 MED ORDER — PANTOPRAZOLE SODIUM 40 MG PO TBEC
40.0000 mg | DELAYED_RELEASE_TABLET | Freq: Every day | ORAL | Status: DC
  Administered 2024-12-09 – 2024-12-11 (×3): 40 mg via ORAL
  Filled 2024-12-08 (×3): qty 1

## 2024-12-08 MED ORDER — CHLORHEXIDINE GLUCONATE 0.12 % MT SOLN: 15.0000 mL | Freq: Once | OROMUCOSAL | Status: AC

## 2024-12-08 MED ORDER — PROPOFOL 500 MG/50ML IV EMUL
INTRAVENOUS | Status: DC | PRN
  Administered 2024-12-08: 11:00:00 50 ug/kg/min via INTRAVENOUS

## 2024-12-08 MED ORDER — LORATADINE 10 MG PO TABS: 10.0000 mg | ORAL_TABLET | Freq: Every day | ORAL | Status: DC | PRN

## 2024-12-08 MED ORDER — HYDROMORPHONE HCL 1 MG/ML IJ SOLN
0.5000 mg | INTRAMUSCULAR | Status: AC | PRN
  Administered 2024-12-08 (×2): 0.5 mg via INTRAVENOUS

## 2024-12-08 MED ORDER — FENTANYL CITRATE (PF) 250 MCG/5ML IJ SOLN
INTRAMUSCULAR | Status: AC
  Filled 2024-12-08: qty 5

## 2024-12-08 MED ORDER — MORPHINE SULFATE ER 15 MG PO TBCR
15.0000 mg | EXTENDED_RELEASE_TABLET | Freq: Three times a day (TID) | ORAL | Status: DC
  Administered 2024-12-08 – 2024-12-11 (×10): 15 mg via ORAL
  Filled 2024-12-08 (×10): qty 1

## 2024-12-08 MED ORDER — ACETAMINOPHEN 500 MG PO TABS
1000.0000 mg | ORAL_TABLET | Freq: Once | ORAL | Status: AC
  Administered 2024-12-08: 08:00:00 1000 mg via ORAL
  Filled 2024-12-08: qty 2

## 2024-12-08 MED ORDER — ESCITALOPRAM OXALATE 20 MG PO TABS
40.0000 mg | ORAL_TABLET | Freq: Every day | ORAL | Status: DC
  Administered 2024-12-08 – 2024-12-10 (×3): 40 mg via ORAL
  Filled 2024-12-08 (×3): qty 2

## 2024-12-08 MED ORDER — CEFAZOLIN SODIUM-DEXTROSE 2-4 GM/100ML-% IV SOLN
INTRAVENOUS | Status: AC
  Filled 2024-12-08: qty 100

## 2024-12-08 MED ORDER — SENNOSIDES-DOCUSATE SODIUM 8.6-50 MG PO TABS
1.0000 | ORAL_TABLET | Freq: Every evening | ORAL | Status: DC | PRN
  Administered 2024-12-11: 1 via ORAL
  Filled 2024-12-08: qty 1

## 2024-12-08 MED ORDER — ACETAMINOPHEN 650 MG RE SUPP: 650.0000 mg | RECTAL | Status: DC | PRN

## 2024-12-08 MED ORDER — ACETAMINOPHEN 325 MG PO TABS: 650.0000 mg | ORAL_TABLET | ORAL | Status: DC | PRN

## 2024-12-08 MED ORDER — CEFAZOLIN SODIUM-DEXTROSE 2-4 GM/100ML-% IV SOLN
2.0000 g | INTRAVENOUS | Status: AC
  Administered 2024-12-08: 10:00:00 2 g via INTRAVENOUS

## 2024-12-08 MED ORDER — LACTATED RINGERS IV SOLN: INTRAVENOUS | Status: DC

## 2024-12-08 MED ORDER — VITAMIN D 25 MCG (1000 UNIT) PO TABS
5000.0000 [IU] | ORAL_TABLET | ORAL | Status: DC
  Administered 2024-12-10: 13:00:00 5000 [IU] via ORAL
  Filled 2024-12-08: qty 5

## 2024-12-08 MED ORDER — ROCURONIUM BROMIDE 10 MG/ML (PF) SYRINGE
PREFILLED_SYRINGE | INTRAVENOUS | Status: DC | PRN
  Administered 2024-12-08: 11:00:00 20 mg via INTRAVENOUS
  Administered 2024-12-08: 10:00:00 50 mg via INTRAVENOUS
  Administered 2024-12-08: 11:00:00 15 mg via INTRAVENOUS

## 2024-12-08 MED ORDER — HYDROCORTISONE 2.5 % EX CREA: 1.0000 | TOPICAL_CREAM | Freq: Every day | CUTANEOUS | Status: DC

## 2024-12-08 MED ORDER — SODIUM CHLORIDE 0.9% FLUSH: 3.0000 mL | INTRAVENOUS | Status: DC | PRN

## 2024-12-08 MED ORDER — CLONAZEPAM 1 MG PO TABS
1.0000 mg | ORAL_TABLET | Freq: Three times a day (TID) | ORAL | Status: DC
  Administered 2024-12-08 – 2024-12-11 (×6): 1 mg via ORAL
  Filled 2024-12-08 (×6): qty 1

## 2024-12-08 MED ORDER — PRAVASTATIN SODIUM 40 MG PO TABS
40.0000 mg | ORAL_TABLET | Freq: Every day | ORAL | Status: DC
  Administered 2024-12-08 – 2024-12-10 (×3): 40 mg via ORAL
  Filled 2024-12-08 (×3): qty 1

## 2024-12-08 MED ORDER — ONDANSETRON HCL 4 MG/2ML IJ SOLN: 4.0000 mg | Freq: Four times a day (QID) | INTRAMUSCULAR | Status: DC | PRN

## 2024-12-08 MED ORDER — EPHEDRINE 5 MG/ML INJ
INTRAVENOUS | Status: AC
  Filled 2024-12-08: qty 5

## 2024-12-08 MED ORDER — PHENOL 1.4 % MT LIQD
1.0000 | OROMUCOSAL | Status: DC | PRN
  Filled 2024-12-08: qty 177

## 2024-12-08 MED ORDER — LIDOCAINE 2% (20 MG/ML) 5 ML SYRINGE
INTRAMUSCULAR | Status: AC
  Filled 2024-12-08: qty 10

## 2024-12-08 MED ORDER — SUGAMMADEX SODIUM 200 MG/2ML IV SOLN
INTRAVENOUS | Status: AC
  Filled 2024-12-08: qty 4

## 2024-12-08 MED ORDER — MIDAZOLAM HCL (PF) 2 MG/2ML IJ SOLN
INTRAMUSCULAR | Status: DC | PRN
  Administered 2024-12-08: 10:00:00 2 mg via INTRAVENOUS

## 2024-12-08 MED ORDER — DOCUSATE SODIUM 100 MG PO CAPS
100.0000 mg | ORAL_CAPSULE | Freq: Two times a day (BID) | ORAL | Status: DC
  Administered 2024-12-08 – 2024-12-11 (×6): 100 mg via ORAL
  Filled 2024-12-08 (×6): qty 1

## 2024-12-08 MED ORDER — CYCLOBENZAPRINE HCL 10 MG PO TABS
ORAL_TABLET | ORAL | Status: AC
  Filled 2024-12-08: qty 1

## 2024-12-08 MED ORDER — MELATONIN 5 MG PO TABS: 10.0000 mg | ORAL_TABLET | Freq: Every day | ORAL | Status: DC

## 2024-12-08 MED ORDER — HYDROMORPHONE HCL 1 MG/ML IJ SOLN
0.2500 mg | INTRAMUSCULAR | Status: DC | PRN
  Administered 2024-12-08 (×4): 0.5 mg via INTRAVENOUS

## 2024-12-08 MED ORDER — SUGAMMADEX SODIUM 200 MG/2ML IV SOLN
INTRAVENOUS | Status: DC | PRN
  Administered 2024-12-08: 12:00:00 180.6 mg via INTRAVENOUS

## 2024-12-08 MED ORDER — TORSEMIDE 20 MG PO TABS: 10.0000 mg | ORAL_TABLET | Freq: Every day | ORAL | Status: DC | PRN

## 2024-12-08 MED ORDER — HYDROMORPHONE HCL 1 MG/ML IJ SOLN: 0.2500 mg | INTRAMUSCULAR | Status: DC | PRN

## 2024-12-08 MED ORDER — OXYCODONE HCL 5 MG PO TABS
5.0000 mg | ORAL_TABLET | ORAL | Status: DC | PRN
  Administered 2024-12-09: 10:00:00 5 mg via ORAL
  Filled 2024-12-08: qty 1

## 2024-12-08 MED ORDER — PROPOFOL 10 MG/ML IV BOLUS
INTRAVENOUS | Status: DC | PRN
  Administered 2024-12-08: 11:00:00 50 mg via INTRAVENOUS
  Administered 2024-12-08: 10:00:00 120 mg via INTRAVENOUS

## 2024-12-08 MED ORDER — SODIUM CHLORIDE 0.9% FLUSH
3.0000 mL | Freq: Two times a day (BID) | INTRAVENOUS | Status: DC
  Administered 2024-12-08 – 2024-12-11 (×7): 3 mL via INTRAVENOUS

## 2024-12-08 MED ORDER — FENTANYL CITRATE (PF) 250 MCG/5ML IJ SOLN
INTRAMUSCULAR | Status: DC | PRN
  Administered 2024-12-08 (×2): 100 ug via INTRAVENOUS
  Administered 2024-12-08: 11:00:00 50 ug via INTRAVENOUS

## 2024-12-08 MED ORDER — THROMBIN 20000 UNITS EX SOLR
CUTANEOUS | Status: DC | PRN
  Administered 2024-12-08: 11:00:00 20 mL via TOPICAL

## 2024-12-08 MED ORDER — MIDAZOLAM HCL 2 MG/2ML IJ SOLN
INTRAMUSCULAR | Status: AC
  Filled 2024-12-08: qty 2

## 2024-12-08 MED ORDER — PHENYLEPHRINE HCL-NACL 20-0.9 MG/250ML-% IV SOLN
INTRAVENOUS | Status: DC | PRN
  Administered 2024-12-08: 10:00:00 40 ug/min via INTRAVENOUS

## 2024-12-08 MED ORDER — ONDANSETRON HCL 4 MG PO TABS: 4.0000 mg | ORAL_TABLET | Freq: Four times a day (QID) | ORAL | Status: DC | PRN

## 2024-12-08 MED ORDER — CEFAZOLIN SODIUM-DEXTROSE 2-4 GM/100ML-% IV SOLN
2.0000 g | Freq: Four times a day (QID) | INTRAVENOUS | Status: AC
  Administered 2024-12-08 (×2): 2 g via INTRAVENOUS
  Filled 2024-12-08 (×2): qty 100

## 2024-12-08 MED ORDER — ONDANSETRON HCL 4 MG/2ML IJ SOLN
INTRAMUSCULAR | Status: AC
  Filled 2024-12-08: qty 4

## 2024-12-08 MED ORDER — 0.9 % SODIUM CHLORIDE (POUR BTL) OPTIME
TOPICAL | Status: DC | PRN
  Administered 2024-12-08: 11:00:00 1000 mL

## 2024-12-08 MED ORDER — PREGABALIN 75 MG PO CAPS
75.0000 mg | ORAL_CAPSULE | Freq: Three times a day (TID) | ORAL | Status: DC
  Administered 2024-12-08 – 2024-12-11 (×9): 75 mg via ORAL
  Filled 2024-12-08 (×9): qty 1

## 2024-12-08 MED ORDER — LOSARTAN POTASSIUM 25 MG PO TABS
25.0000 mg | ORAL_TABLET | Freq: Every day | ORAL | Status: DC
  Administered 2024-12-10 – 2024-12-11 (×2): 25 mg via ORAL
  Filled 2024-12-08 (×2): qty 1

## 2024-12-08 MED ORDER — ORAL CARE MOUTH RINSE: 15.0000 mL | Freq: Once | OROMUCOSAL | Status: AC

## 2024-12-08 MED ORDER — SURGIFLO WITH THROMBIN (HEMOSTATIC MATRIX KIT) OPTIME
TOPICAL | Status: DC | PRN
  Administered 2024-12-08: 11:00:00 1 via TOPICAL

## 2024-12-08 MED ORDER — CHLORHEXIDINE GLUCONATE 0.12 % MT SOLN
OROMUCOSAL | Status: AC
  Administered 2024-12-08: 08:00:00 15 mL via OROMUCOSAL
  Filled 2024-12-08: qty 15

## 2024-12-08 MED ORDER — LACTATED RINGERS IV SOLN: INTRAVENOUS | Status: DC | PRN

## 2024-12-08 MED ORDER — SODIUM CHLORIDE 0.9 % IV SOLN: 250.0000 mL | INTRAVENOUS | Status: AC

## 2024-12-08 SURGICAL SUPPLY — 58 items
BAG COUNTER SPONGE SURGICOUNT (BAG) ×1 IMPLANT
BENZOIN TINCTURE PRP APPL 2/3 (GAUZE/BANDAGES/DRESSINGS) ×1 IMPLANT
BIT DRILL NEURO 2X3.1 SFT TUCH (MISCELLANEOUS) ×1 IMPLANT
BLADE CLIPPER SURG (BLADE) ×1 IMPLANT
BLADE SURG 15 STRL LF DISP TIS (BLADE) ×1 IMPLANT
CANISTER SUCTION 3000ML PPV (SUCTIONS) ×1 IMPLANT
CORD BIPOLAR FORCEPS 12FT (ELECTRODE) ×1 IMPLANT
COVER SURGICAL LIGHT HANDLE (MISCELLANEOUS) ×1 IMPLANT
DRAPE C-ARM 42X72 X-RAY (DRAPES) ×1 IMPLANT
DRAPE POUCH INSTRU U-SHP 10X18 (DRAPES) ×1 IMPLANT
DRAPE SURG 17X23 STRL (DRAPES) ×3 IMPLANT
DURAPREP 26ML APPLICATOR (WOUND CARE) ×1 IMPLANT
ELECT COATED BLADE 2.86 ST (ELECTRODE) ×1 IMPLANT
ELECTRODE REM PT RTRN 9FT ADLT (ELECTROSURGICAL) ×1 IMPLANT
GAUZE 4X4 16PLY ~~LOC~~+RFID DBL (SPONGE) ×1 IMPLANT
GAUZE SPONGE 4X4 12PLY STRL (GAUZE/BANDAGES/DRESSINGS) ×1 IMPLANT
GLOVE BIO SURGEON STRL SZ 6.5 (GLOVE) ×1 IMPLANT
GLOVE BIO SURGEON STRL SZ8 (GLOVE) ×1 IMPLANT
GLOVE BIOGEL PI IND STRL 7.0 (GLOVE) ×2 IMPLANT
GLOVE BIOGEL PI IND STRL 8 (GLOVE) ×1 IMPLANT
GOWN STRL REUS W/ TWL LRG LVL3 (GOWN DISPOSABLE) ×1 IMPLANT
GOWN STRL REUS W/ TWL XL LVL3 (GOWN DISPOSABLE) ×1 IMPLANT
IV CATH 14GX2 1/4 (CATHETERS) ×1 IMPLANT
KIT BASIN OR (CUSTOM PROCEDURE TRAY) ×1 IMPLANT
KIT TURNOVER KIT B (KITS) ×1 IMPLANT
NDL PRECISIONGLIDE 27X1.5 (NEEDLE) ×1 IMPLANT
NDL SPNL 18GX3.5 QUINCKE PK (NEEDLE) ×1 IMPLANT
NEEDLE PRECISIONGLIDE 27X1.5 (NEEDLE) ×1 IMPLANT
NEEDLE SPNL 18GX3.5 QUINCKE PK (NEEDLE) ×1 IMPLANT
PACK ORTHO CERVICAL (CUSTOM PROCEDURE TRAY) ×1 IMPLANT
PAD ARMBOARD POSITIONER FOAM (MISCELLANEOUS) ×2 IMPLANT
PATTIES SURGICAL .5 X.5 (GAUZE/BANDAGES/DRESSINGS) ×1 IMPLANT
PATTIES SURGICAL .5 X1 (DISPOSABLE) IMPLANT
PIN DISTRACTION 14 (PIN) IMPLANT
PLATE SP CERV ZERO P 8 1LVL 4H (Plate) IMPLANT
POSITIONER HEAD DONUT 9IN (MISCELLANEOUS) ×1 IMPLANT
SCREW 16MM TI CERV LOCK (Screw) IMPLANT
SOLN 0.9% NACL POUR BTL 1000ML (IV SOLUTION) ×1 IMPLANT
SOLN STERILE WATER BTL 1000 ML (IV SOLUTION) ×1 IMPLANT
SPACER PARA FREEZE DRIED 13X9 (Spacer) IMPLANT
SPACER PARA FREEZE DRIED 8X9.5 (Spacer) IMPLANT
SPONGE INTESTINAL PEANUT (DISPOSABLE) ×1 IMPLANT
SPONGE SURGIFOAM ABS GEL 100 (HEMOSTASIS) IMPLANT
STRIP CLOSURE SKIN 1/2X4 (GAUZE/BANDAGES/DRESSINGS) ×1 IMPLANT
SURGIFLO W/THROMBIN 8M KIT (HEMOSTASIS) IMPLANT
SUT MNCRL AB 4-0 PS2 18 (SUTURE) ×1 IMPLANT
SUT SILK 2-0 18XBRD TIE 12 (SUTURE) ×1 IMPLANT
SUT SILK 4-0 18XBRD TIE 12 (SUTURE) IMPLANT
SUT VIC AB 2-0 CT2 18 VCP726D (SUTURE) ×1 IMPLANT
SYR BULB IRRIG 60ML STRL (SYRINGE) ×1 IMPLANT
SYR CONTROL 10ML LL (SYRINGE) ×2 IMPLANT
TAPE CLOTH 4X10 WHT NS (GAUZE/BANDAGES/DRESSINGS) ×1 IMPLANT
TAPE CLOTH SURG 4X10 WHT LF (GAUZE/BANDAGES/DRESSINGS) IMPLANT
TAPE UMBILICAL 1/8X30 (MISCELLANEOUS) ×2 IMPLANT
TOWEL GREEN STERILE (TOWEL DISPOSABLE) ×1 IMPLANT
TOWEL GREEN STERILE FF (TOWEL DISPOSABLE) ×1 IMPLANT
YANKAUER SUCT BULB TIP NO VENT (SUCTIONS) ×1 IMPLANT
cc natural lordotic standard 8 mm  NEED ITEM # (Cage) IMPLANT

## 2024-12-08 NOTE — Anesthesia Procedure Notes (Signed)
 Procedure Name: Intubation Date/Time: 12/08/2024 10:13 AM  Performed by: Boyce Shilling, CRNAPre-anesthesia Checklist: Patient identified, Emergency Drugs available, Suction available, Timeout performed and Patient being monitored Patient Re-evaluated:Patient Re-evaluated prior to induction Oxygen Delivery Method: Circle system utilized Preoxygenation: Pre-oxygenation with 100% oxygen Induction Type: IV induction Ventilation: Mask ventilation without difficulty Laryngoscope Size: Glidescope and 3 Grade View: Grade I Tube type: Oral Tube size: 7.0 mm Number of attempts: 1 Airway Equipment and Method: Stylet and Rigid stylet Placement Confirmation: ETT inserted through vocal cords under direct vision, positive ETCO2, CO2 detector and breath sounds checked- equal and bilateral Secured at: 21 cm Tube secured with: Tape Dental Injury: Teeth and Oropharynx as per pre-operative assessment  Difficulty Due To: Difficult Airway- due to anterior larynx

## 2024-12-08 NOTE — Transfer of Care (Signed)
 Immediate Anesthesia Transfer of Care Note  Patient: Brandy Delacruz  Procedure(s) Performed: ANTERIOR CERVICAL DECOMPRESSION/DISCECTOMY FUSION CERVICAL SIX-SEVEN, CERVICAL SEVEN- THORACIC ONE (Spine Cervical)  Patient Location: PACU  Anesthesia Type:General  Level of Consciousness: awake and alert   Airway & Oxygen Therapy: Patient Spontanous Breathing and Patient connected to face mask oxygen  Post-op Assessment: Report given to RN and Post -op Vital signs reviewed and stable  Post vital signs: Reviewed and stable  Last Vitals:  Vitals Value Taken Time  BP 135/84 12/08/24 12:07  Temp    Pulse 75 12/08/24 12:13  Resp 11 12/08/24 12:13  SpO2 100 % 12/08/24 12:13  Vitals shown include unfiled device data.  Last Pain:  Vitals:   12/08/24 0825  TempSrc:   PainSc: 0-No pain         Complications: No notable events documented.

## 2024-12-08 NOTE — Progress Notes (Signed)
 Orthopedic Tech Progress Note Patient Details:  Brandy Delacruz Jul 17, 1961 [id number]  Patient ID: Brandy Delacruz, female   DOB: [birth year], 63 y.o.   MRN: [id number] Brandy Delacruz to apply soft collar and pt already had one on. Brandy Delacruz Brandy Delacruz Brandy Delacruz 12/08/2024, 5:09 PM

## 2024-12-08 NOTE — H&P (Signed)
 Orthopaedic Service H&P/Consult     Patient ID: Brandy Delacruz MRN: [id number] DOB/AGE: [birth year] 61 y.o.  Chief Complaint: Neck and arm pain HPI: Brandy Delacruz is an 63 y.o. female.  Who complains of significant neck and arm symptoms.  She had previous, anterior cervical decompression fusion between the chest and second-generation at the C6-7 and C7-T1 levels below her prior surgery.  She had tried a significant course of out of conservative care which unfortunately did not provide relief.  She presents today for surgical treatment of her radiculopathy and generative disc disease  Past Medical History:  Diagnosis Date   Anxiety    Arthritis    Chronic kidney disease    Depression    Difficult intubation    anterior larynx per intubation note 09/01/21   GERD (gastroesophageal reflux disease)    Hypercholesteremia    Hypertension     Past Surgical History:  Procedure Laterality Date   ABDOMINAL HYSTERECTOMY     BACK SURGERY     bladder stimulator     EAR MASTOIDECTOMY W/ COCHLEAR IMPLANT W/ LANDMARK     KNEE SURGERY Right    NECK SURGERY     TONSILLECTOMY      Family History  Problem Relation Age of Onset   Diabetes Mother    Stroke Mother    Social History:  reports that she has quit smoking. She has never used smokeless tobacco. She reports current alcohol use. She reports that she does not use drugs.  Allergies: Allergies[1]  Medications Prior to Admission  Medication Sig Dispense Refill   Cholecalciferol  (DIALYVITE VITAMIN D  5000) 125 MCG (5000 UT) capsule Take 5,000 Units by mouth at bedtime.     clonazePAM  (KLONOPIN ) 1 MG tablet Take 1 mg by mouth 3 (three) times daily.     docusate sodium  (COLACE) 100 MG capsule Take 100 mg by mouth 2 (two) times daily.     escitalopram  (LEXAPRO ) 20 MG tablet Take 40 mg by mouth at bedtime.     famotidine  (PEPCID ) 20 MG tablet Take 20 mg by mouth 2 (two) times daily.     hydrocortisone  2.5 % cream Apply 1  Application topically daily.     losartan  (COZAAR ) 50 MG tablet Take 25 mg by mouth daily.     lovastatin (MEVACOR) 40 MG tablet Take 40 mg by mouth at bedtime.     LYRICA  75 MG capsule Take 75 mg by mouth 3 (three) times daily.     Melatonin 10 MG TABS Take 10-20 mg by mouth at bedtime.     morphine  (MS CONTIN ) 15 MG 12 hr tablet Take 15 mg by mouth every 8 (eight) hours.     omeprazole (PRILOSEC) 40 MG capsule Take 40 mg by mouth daily.     tiZANidine  (ZANAFLEX ) 4 MG tablet Take 6 mg by mouth 3 (three) times daily as needed for muscle spasms.     cetirizine (ZYRTEC) 10 MG tablet Take 10 mg by mouth daily as needed for allergies. (Patient not taking: Reported on 12/01/2024)     torsemide  (DEMADEX ) 10 MG tablet Take 10 mg by mouth daily as needed (swelling). (Patient not taking: Reported on 12/01/2024)      No results found for this or any previous visit (from the past 48 hours). No results found.  Review of symptoms is negative except as mentioned above  Blood pressure (!) 85/66, pulse 60, temperature 99.1 F (37.3 C), temperature source Oral, resp. rate 18, height 5' 1 (1.549  m), weight 90.3 kg, SpO2 92%. Physical exam:  Well-appearing woman no acute distress Normocephalic atraumatic Mood is calm Heart rate is regular on the monitor Breathing comfortably room air Abdomen is soft Intact sensation in the C5-T6 dermatomes, slight decrease sensation in the C7 and T1 dermatomes bilaterally 5/5 strength in the deltoids biceps triceps wrist flexors wrist extensors grips and intrinsics    Assessment/Plan  Cervical radiculopathy, adjacent segment degeneration  The patient continues to have severe pain stemming from the apparent change in C6-7 and C7-T1 levels.  Again she has failed conservative care.  She comes today for surgical treatment.  This will involve an anterior cervical decompression and fusion at C6-7 and C7-T1.  We again reviewed the risks, benefits and alternatives.  Risk  include but limited to bleeding, infection, injury to nerves, dysphagia, dysphonia, failure resolve her symptoms, need for additional surgery.  We also discussed her postoperative course.  In the past she had severe pain after surgery.  Discussed typically patients are able to be discharged to home after the surgery we will certainly assess her pain afterwards and can make appropriate accommodations including admission depending on how she does.   Cordella Rhein, MD, MS [facility] [phone]       [1]  Allergies Allergen Reactions   Ibuprofen Nausea And Vomiting, Nausea Only and Other (See Comments)    ibuprofen   Nsaids Other (See Comments)    Pt has GERD and patient has stage 3 kidney disease

## 2024-12-08 NOTE — Plan of Care (Signed)

## 2024-12-08 NOTE — Op Note (Addendum)
 PATIENT NAME: Brandy Delacruz [facility]   MEDICAL RECORD NO.:   [id number]    DATE OF BIRTH: 08/12/1961   DATE OF PROCEDURE: 12/08/24                               OPERATIVE REPORT     PREOPERATIVE DIAGNOSES: 1. left-sided cervical radiculopathy. 2. Spinal stenosis . 3.  Spondylolisthesis 4.  Adjacent segment degeneration   POSTOPERATIVE DIAGNOSES: 1. left and bilateral-sided cervical radiculopathy. 2. Spinal stenosis  3.  Spondylolisthesis 4.  Adjacent segment degeneration   PROCEDURE: 1. Anterior cervical decompression and fusion C6-7, C7-T1. 2. Placement of anterior instrumentation, C6-7, C7-T1 3. Allograft 4. Intraoperative use of fluoroscopy.  Given that this was a revision procedure, below a prior ACDF, with significant scar tissue and the low location of the surgery (C7-T1) with the sternum limiting the exposure, this surgery was 50% more compicated than a standard ACDF     SURGEON:  Cordella Rhein, MD, Brandy   ASSISTANT: None   ANESTHESIA:  General endotracheal anesthesia.   COMPLICATIONS:  None.   DISPOSITION:  Stable.   ESTIMATED BLOOD LOSS: 100.   INDICATIONS FOR SURGERY:   Brandy Delacruz is a pleasant 63 y.o. -year- old patient, who did present to me with severe pain in her neck and left arm.  The patient's MRI did reveal the findings noted above.  Given the patient's ongoing rather debilitating pain and lack of improvement with appropriate treatment measures, we did discuss proceeding with the procedure noted above.  The patient was fully aware of the risks and limitations of surgery as outlined in my preoperative note.  These included but were not limited to bleeding, infection, injury to the nerves, injury to the spinal cord, dysphagia, dysphonia, failure to resolve the pre-operative symptoms and the need for additional surgery.   OPERATIVE DETAILS:  On 12/08/2024 the patient was brought to surgery and general endotracheal anesthesia was administered.   The patient was placed supine on the hospital bed. The neck was gently extended.  All bony prominences were meticulously padded.  The neck was prepped and draped in the usual sterile fashion.  At this point, I did make a left-sided transverse incision.  The platysma was incised.  A Smith-Robinson approach was used and the anterior spine was identified. A self-retaining retractor was placed.  I then subperiosteally exposed the vertebral bodies from C6-T1.  The prior plate extend to C6 was identified Caspar pins were then placed into the C C6 and T1 vertebral bodies and distraction was applied.  A thorough and complete C 6 7 and C7-T1 intervertebral diskectomy was performed.  The posterior longitudinal ligament was identified and entered using a curette.  I then used a#2 Kerrison to perform a thorough and complete intervertebral diskectomy.  The spinal canal was thoroughly decompressed, as was the neuroforamen.  A nerve hook was placed into the foramen and no residual compression was noted.  The endplates were then prepared .  We trialed.  A size 8 parallel 0P natural graft and plate was placed at C 6 7.  A size 8 lordotic 0P natural graft and plate was placed at C7-1.  H location 14 mm screws were used.  Therefore screws each location.  These were engaged until torque limiter to collect and then engaged into the plate.  Final fluoroscopic images showed acceptable position of an ACDF at the C 6 7 through C 7-T1 levels..  The  wound was then irrigated.  The wound was then explored for any undue bleeding and there was no bleeding noted. The wound was copiously irrigated.  The platysma was closed with a 2-0 Vicryl.  3-0 Vicryl was used for the deep dermal layers followed a a 4-0 prolene for the skin.  Benzoin and Steri-Strips were applied, followed by sterile dressing.  The patient was placed in a soft collar.  The patient was awoken by anesthesia and transferred to the PACU in stable condition.       Given that this was a revision procedure, below a prior ACDF, with significant scar tissue and the low location of the surgery (C7-T1) with the sternum limiting the exposure, this surgery was 50% more compicated than a standard ACDF      Cordella Rhein, MD, Brandy [facility] / [place] [phone]

## 2024-12-08 NOTE — Anesthesia Postprocedure Evaluation (Signed)
 Anesthesia Post Note  Patient: Brandy Delacruz  Procedure(s) Performed: ANTERIOR CERVICAL DECOMPRESSION/DISCECTOMY FUSION CERVICAL SIX-SEVEN, CERVICAL SEVEN- THORACIC ONE (Spine Cervical)     Patient location during evaluation: PACU Anesthesia Type: General Level of consciousness: awake and alert Pain management: pain level controlled Vital Signs Assessment: post-procedure vital signs reviewed and stable Respiratory status: spontaneous breathing, nonlabored ventilation, respiratory function stable and patient connected to nasal cannula oxygen Cardiovascular status: blood pressure returned to baseline and stable Postop Assessment: no apparent nausea or vomiting Anesthetic complications: no   No notable events documented.  Last Vitals:  Vitals:   12/08/24 1430 12/08/24 1528  BP: 91/62 116/78  Pulse: 76 74  Resp: 14 16  Temp: 36.4 C 36.8 C  SpO2: 92% 98%    Last Pain:  Vitals:   12/08/24 1528  TempSrc: Oral  PainSc:                  Liann Spaeth,W. EDMOND

## 2024-12-08 NOTE — Discharge Instructions (Signed)
 DISCHARGE INSTRUCTIONS Anterior Cervical Fusion and Arthroplasty Surgery          YOUR COLLAR  You will be given a cervical collar to wear postoperatively.  You must wear the collar at all times until seen in the office for  your first post-operative visit   You may remove it to shower. SHOWERING  You may shower as normal once the large, bulky bandages are removed from your incisions.  If they are not removed before  your discharge from the hospital, you may remove them 36-48hrs after your surgery.  Hair washing is permissible while in the  shower.      No tub baths, hot tubs or whirlpools until seen in the office.   EXERCISE  You have unlimited walking and stair climbing privileges.  Walking outside (in nice weather only) or walking on a treadmill (no  incline) is also allowed.     Do NOT lift anything weighing greater than 10-15lbs.  Especially try to avoid lifting or reaching above your head.     INCISION  Please make sure your incisions are checked at least twice daily for signs and symptoms of infection: If any of the below should occur, please call the office. Drainage from incision site Opening of incisions Fevers greater than 101 Flu-like symptoms Increased redness and/or tenderness   If you have staples or sutures (not tape) in your incision they may be removed 2 weeks following your surgery.  This may be  done by a visiting nurse, family physician or by making an appointment to come into the office. SLEEPING  You may sleep in any position which makes you comfortable as long as your collar is securely in place.  Many patients find  comfort sleeping in a recliner chair.  It is normal to have difficulty sleeping for the first several weeks following your surgery.   We recommend trying Benadryl or Tylenol  PM (both are over the counter drugs at the drugstore). EATING  It is normal to have a sore throat and some difficulty swallowing solid foods.  This may persist for several weeks.    Eating soft  foods like yogurt, milkshakes and mashed potatoes seem to help. L PAIN Do NOT take any anti-inflammatory medication (Advil, Aleve, Motrin) for the first ten weeks following your surgery.  A pain medication should have been prescribed for you.      To help alleviate persistent soreness around the bone graft site or between the shoulder blades, apply ice or warm moist  compresses.  It is normal for graft discomfort to persist for several weeks following your surgery.      Walking as much as you feel able.  This will help to loosen the muscles in your neck and decrease pain.    DRIVING You may not drive a car until told otherwise by your physician (usually 2 weeks).   You may be a passenger for short distances (20-30 minutes).  If you must take a longer trip, make sure to make several stops so that you can walk around and stretch your legs.  Reclining the passenger seat seems to be the most comfortable position for most patients. It is illegal to drive a car while wearing a cervical orthosis.  FOLLOW-UP APPOINTMENTS You should already have an appointment scheduled but if not, please make an appointment for 2 - 3 weeks from your surgery date unless you need to have sutures or staples removed.     CALL Office [phone] with any concerns  Cordella Rhein, MD, MS [facility] [phone]

## 2024-12-08 NOTE — Anesthesia Preprocedure Evaluation (Addendum)
 Anesthesia Evaluation  Patient identified by MRN, date of birth, ID band Patient awake    Reviewed: Allergy & Precautions, H&P , NPO status , Patient's Chart, lab work & pertinent test results  History of Anesthesia Complications (+) DIFFICULT AIRWAY and history of anesthetic complications  Airway Mallampati: III  TM Distance: >3 FB Neck ROM: Limited    Dental no notable dental hx. (+) Teeth Intact, Dental Advisory Given   Pulmonary former smoker   Pulmonary exam normal breath sounds clear to auscultation       Cardiovascular hypertension, Pt. on medications  Rhythm:Regular Rate:Normal     Neuro/Psych   Anxiety Depression    negative neurological ROS     GI/Hepatic Neg liver ROS,GERD  Medicated,,  Endo/Other    Class 3 obesity  Renal/GU Renal disease  negative genitourinary   Musculoskeletal  (+) Arthritis , Osteoarthritis,    Abdominal   Peds  Hematology negative hematology ROS (+)   Anesthesia Other Findings   Reproductive/Obstetrics negative OB ROS                              Anesthesia Physical Anesthesia Plan  ASA: 3  Anesthesia Plan: General   Post-op Pain Management: Tylenol  PO (pre-op)*   Induction: Intravenous  PONV Risk Score and Plan: 4 or greater and Ondansetron , Dexamethasone  and Midazolam   Airway Management Planned: Oral ETT and Video Laryngoscope Planned  Additional Equipment:   Intra-op Plan:   Post-operative Plan: Extubation in OR  Informed Consent: I have reviewed the patients History and Physical, chart, labs and discussed the procedure including the risks, benefits and alternatives for the proposed anesthesia with the patient or authorized representative who has indicated his/her understanding and acceptance.     Dental advisory given  Plan Discussed with: CRNA  Anesthesia Plan Comments:          Anesthesia Quick Evaluation

## 2024-12-09 LAB — HEMOGLOBIN A1C
Hgb A1c MFr Bld: 5.7 % — ABNORMAL HIGH (ref 4.8–5.6)
Mean Plasma Glucose: 116.89 mg/dL

## 2024-12-09 LAB — GLUCOSE, CAPILLARY
Glucose-Capillary: 119 mg/dL — ABNORMAL HIGH (ref 70–99)
Glucose-Capillary: 130 mg/dL — ABNORMAL HIGH (ref 70–99)
Glucose-Capillary: 147 mg/dL — ABNORMAL HIGH (ref 70–99)

## 2024-12-09 MED ORDER — INSULIN ASPART 100 UNIT/ML IJ SOLN
0.0000 [IU] | Freq: Three times a day (TID) | INTRAMUSCULAR | Status: DC
  Administered 2024-12-09 – 2024-12-10 (×2): 2 [IU] via SUBCUTANEOUS
  Administered 2024-12-10: 13:00:00 3 [IU] via SUBCUTANEOUS
  Administered 2024-12-10 – 2024-12-11 (×3): 2 [IU] via SUBCUTANEOUS
  Filled 2024-12-09: qty 3
  Filled 2024-12-09 (×2): qty 2
  Filled 2024-12-09: qty 3
  Filled 2024-12-09 (×2): qty 2

## 2024-12-09 MED ORDER — DEXAMETHASONE SOD PHOSPHATE PF 10 MG/ML IJ SOLN
10.0000 mg | Freq: Four times a day (QID) | INTRAMUSCULAR | Status: AC
  Administered 2024-12-09 – 2024-12-10 (×4): 10 mg via INTRAVENOUS

## 2024-12-09 NOTE — Evaluation (Signed)
 Physical Therapy Evaluation  Patient Details Name: Brandy Delacruz MRN: [id number] DOB: Oct 12, 1961 Today's Date: 12/09/2024  History of Present Illness  Pt is a 63 y.o female admitted 12/16 for scheduled ACDF C6-T1. PMH: CKD, HTN  Clinical Impression  Pt admitted with above diagnosis. At the time of PT eval, pt was able to demonstrate transfers and ambulation with gross CGA to min assist and RW for support. Pt was educated on precautions, brace application/wearing schedule, appropriate activity progression, and positioning recommendations. Pt currently with functional limitations due to the deficits listed below (see PT Problem List). Pt reports L sided pain and weakness prior to surgery, and now with hypersensitivity in B UE's (R worse than L), with more right sided weakness and R drop foot.  Son present and reports he and wife will be able to care for pt at home at d/c. Recommend exclusive RW use at this time with hands on guarding as R knee buckling intermittently and pt is at an increased risk for falls. Pt will benefit from skilled PT to increase their independence and safety with mobility to allow discharge to the venue listed below.          If plan is discharge home, recommend the following: A little help with walking and/or transfers;A little help with bathing/dressing/bathroom;Assistance with cooking/housework;Assist for transportation;Help with stairs or ramp for entrance   Can travel by private vehicle        Equipment Recommendations None recommended by PT  Recommendations for Other Services       Functional Status Assessment Patient has had a recent decline in their functional status and demonstrates the ability to make significant improvements in function in a reasonable and predictable amount of time.     Precautions / Restrictions Precautions Precautions: Fall;Cervical Precaution Booklet Issued: Yes (comment) Recall of Precautions/Restrictions:  Impaired Restrictions Weight Bearing Restrictions Per Provider Order: No      Mobility  Bed Mobility Overal bed mobility: Needs Assistance Bed Mobility: Supine to Sit, Sit to Supine     Supine to sit: Min assist Sit to supine: Min assist   General bed mobility comments: HOB elevated to sit EOB and HOB flat for return to bed at end of session. VC's throughout for optimal log roll technique. Assist required for trunk elevation to full sitting position and for RLE elevation back up into bed at end of session.    Transfers Overall transfer level: Needs assistance Equipment used: Rolling walker (2 wheels) Transfers: Sit to/from Stand Sit to Stand: Contact guard assist           General transfer comment: VC's for hand placement on seated surface for safety. Pt pushed up with LUE and kept RUE on walker due to increased pain on the R side.    Ambulation/Gait Ambulation/Gait assistance: Contact guard assist Gait Distance (Feet): 75 Feet Assistive device: Rolling walker (2 wheels) Gait Pattern/deviations: Step-through pattern, Decreased stride length, Trunk flexed Gait velocity: Decreased Gait velocity interpretation: <1.31 ft/sec, indicative of household ambulator   General Gait Details: VC's for improved posture, closer walker proximity and forward gaze. Pt moving slow and guarded. Able to make corrective changes with cues. No overt LOB noted but steps appear effortful.  Stairs            Wheelchair Mobility     Tilt Bed    Modified Rankin (Stroke Patients Only)       Balance Overall balance assessment: Needs assistance Sitting-balance support: No upper extremity supported, Feet supported  Sitting balance-Leahy Scale: Poor Sitting balance - Comments: CGA Postural control: Posterior lean Standing balance support: Bilateral upper extremity supported, During functional activity, Reliant on assistive device for balance Standing balance-Leahy Scale: Poor Standing  balance comment: Reliant on RW and min assist                             Pertinent Vitals/Pain Pain Assessment Pain Assessment: Faces Faces Pain Scale: Hurts even more Pain Location: BUEs Pain Descriptors / Indicators: Burning, Tingling Pain Intervention(s): Limited activity within patient's tolerance, Monitored during session, Repositioned    Home Living Family/patient expects to be discharged to:: Private residence Living Arrangements: Children;Other relatives Available Help at Discharge: Family;Available 24 hours/day Type of Home: House Home Access: Level entry       Home Layout: Multi-level;Able to live on main level with bedroom/bathroom Home Equipment: Rolling Walker (2 wheels);Cane - single point;BSC/3in1;Shower seat;Wheelchair - manual      Prior Function Prior Level of Function : Needs assist;History of Falls (last six months)             Mobility Comments: Pt reports, primarily holding onto walls with mobility. Hx of falls, reports most recent fall 3 weeks prior to admisson resulted in concussion ADLs Comments: Son assists with LB ADLs     Extremity/Trunk Assessment   Upper Extremity Assessment RUE Deficits / Details: Burning sensationg R>L, poor grip strength, Wrist and elbow AROM WFL. Painful AROM with shoulder movements 2/5 RUE Sensation: decreased light touch RUE Coordination: decreased fine motor;decreased gross motor LUE Deficits / Details: Burning sensationg R>L, poor grip strength, Wrist and elbow AROM WFL. Painful AROM with shoulder movements 2/5 LUE Sensation: decreased light touch LUE Coordination: decreased fine motor;decreased gross motor    Lower Extremity Assessment Lower Extremity Assessment: RLE deficits/detail RLE Deficits / Details: Acute R foot drop s/p surgery. Able to wiggle toes but unable to perform active ankle DF while supine in the bed. During ambulation, pt able to demonstrate heel strike, however limited. RLE  Sensation: decreased light touch    Cervical / Trunk Assessment Cervical / Trunk Assessment: Neck Surgery  Communication   Communication Communication: Impaired Factors Affecting Communication: Reduced clarity of speech    Cognition Arousal: Alert Behavior During Therapy: Flat affect   PT - Cognitive impairments: No apparent impairments                         Following commands: Intact       Cueing Cueing Techniques: Verbal cues     General Comments      Exercises     Assessment/Plan    PT Assessment Patient needs continued PT services  PT Problem List Decreased strength;Decreased range of motion;Decreased activity tolerance;Decreased balance;Decreased mobility;Decreased safety awareness;Decreased knowledge of precautions;Decreased knowledge of use of DME;Impaired sensation;Pain       PT Treatment Interventions DME instruction;Gait training;Stair training;Functional mobility training;Therapeutic activities;Therapeutic exercise;Balance training;Patient/family education    PT Goals (Current goals can be found in the Care Plan section)  Acute Rehab PT Goals Patient Stated Goal: Home at d/c PT Goal Formulation: With patient/family Time For Goal Achievement: 12/23/24 Potential to Achieve Goals: Good    Frequency Min 5X/week     Co-evaluation               AM-PAC PT 6 Clicks Mobility  Outcome Measure Help needed turning from your back to your side while in a flat bed  without using bedrails?: A Little Help needed moving from lying on your back to sitting on the side of a flat bed without using bedrails?: A Little Help needed moving to and from a bed to a chair (including a wheelchair)?: A Little Help needed standing up from a chair using your arms (e.g., wheelchair or bedside chair)?: A Little Help needed to walk in hospital room?: A Little Help needed climbing 3-5 steps with a railing? : A Little 6 Click Score: 18    End of Session Equipment  Utilized During Treatment: Gait belt;Cervical collar Activity Tolerance: Patient tolerated treatment well Patient left: in bed;with call bell/phone within reach;with family/visitor present Nurse Communication: Mobility status PT Visit Diagnosis: Unsteadiness on feet (R26.81);Pain Pain - Right/Left:  (Both, R worse than L) Pain - part of body: Arm;Hand;Ankle and joints of foot;Leg    Time: 1137-1213 PT Time Calculation (min) (ACUTE ONLY): 36 min   Charges:     PT Treatments $Gait Training: 23-37 mins PT General Charges $$ ACUTE PT VISIT: 1 Visit         Leita Sable, PT, DPT Acute Rehabilitation Services Secure Chat Preferred Office: [phone]   Leita JONETTA Sable 12/09/2024, 1:55 PM

## 2024-12-09 NOTE — Progress Notes (Signed)
°  Subjective: Procedures (LRB): ANTERIOR CERVICAL DECOMPRESSION/DISCECTOMY FUSION CERVICAL SIX-SEVEN, CERVICAL SEVEN- THORACIC ONE (N/A) 1 Day Post-Op  Patient reports pain as 8 on 0-10 scale.  Reports increased arm pain reports incisional neck pain   Positive void Negative bowel movement Negative positive Negative chest pain or shortness of breath  Patient reports hypersensitive to light touch primary in the right hand with some of the left side as well 2.  Reports some degree of incisional pain but this is more manageable.  Some trouble swallowing but she is able to get food down  Objective: Vital signs in last 24 hours: Temp:  [97.6 F (36.4 C)-98.9 F (37.2 C)] 98 F (36.7 C) (12/17 0732) Pulse Rate:  [58-77] 74 (12/17 0732) Resp:  [9-18] 18 (12/17 0300) BP: (89-135)/(57-84) 103/72 (12/17 0732) SpO2:  [91 %-100 %] 91 % (12/17 0732)  Intake/Output from previous day: 12/16 0701 - 12/17 0700 In: 1380 [P.O.:480; I.V.:800; IV Piggyback:100] Out: 50 [Blood:50]  Labs: No results for input(s): WBC, RBC, HCT, PLT in the last 72 hours. No results for input(s): NA, K, CL, CO2, BUN, CREATININE, GLUCOSE, CALCIUM in the last 72 hours. No results for input(s): LABPT, INR in the last 72 hours.  Physical Exam:  Body mass index is 37.6 kg/m.  Incision C/D/I  Sensation     Right      Left  C5   Intact     Intact C6   Hypersensitive   hypersensitive C7    hypersensitive    hypersensitive C8   hypersensitive    hypersensitive T1   Intact     Intact   Motor Exam     Right     Left Deltoids  5/5     5/5  Triceps   5/5     5/5  Biceps   5/5     5/5  Wrist Flexors  5/5     5/5  Wrist Extensors 5/5     5/5  Grip    5/5     5/5  Intrinsics  5/5     5/5    Assessment/Plan: Patient does have some hypersensitivity and paresthesias in bilateral hands following the surgery.  She is does have good strength.  We are gena start some steroids to see if  this helps.  We discussed again she is quite tolerant to pain medications given that she is on chronic narcotics.  This may account for some of the pain.  Will continue to treat her pain accordingly.  She is already on Lyrica  as well as MS Contin .  Will continue with short acting agents as well.  Have her work with physical therapy.  Will see how she progresses with the steroids.  Will place her on sliding scale to cover her sugars as well.   Cordella Rhein, MD, MS [facility] Orthopedics Specialist / [place] [phone]

## 2024-12-09 NOTE — Evaluation (Signed)
 Occupational Therapy Evaluation Patient Details Name: Brandy Delacruz MRN: [id number] DOB: January 27, 1961 Today's Date: 12/09/2024   History of Present Illness   Pt is a 64 y.o female admitted 12/16 for scheduled ACDF C6-T1. PMH: CKD, HTN     Clinical Impressions Pt admitted based on above, and was seen based on problem list below. PTA pt was living with her son, and reports assistance for ADLs with a hx of falls. Today pt is requiring set up  to mod assist for ADLs. Bed mobility and functional transfers are  mod assist with RW. Pt c/o of increased post-op nerve pain. Reporting bil burning sensation in UEs R>L. Pt limited today d/t pain, and easily distracted by pain. Provided and reviewed cervical precautions handout with pr, but would benefit from continued education and practice with compensatory strategies. Pt reproting multiple family members able to provide 24/7 care, recommending HHOT. OT will continue to follow acutely to maximize functional independence.        If plan is discharge home, recommend the following:   A lot of help with walking and/or transfers;A lot of help with bathing/dressing/bathroom;Assistance with cooking/housework;Assist for transportation     Functional Status Assessment   Patient has had a recent decline in their functional status and demonstrates the ability to make significant improvements in function in a reasonable and predictable amount of time.     Equipment Recommendations   None recommended by OT     Recommendations for Other Services   PT consult     Precautions/Restrictions   Precautions Precautions: Fall;Cervical Precaution Booklet Issued: Yes (comment) Recall of Precautions/Restrictions: Impaired Restrictions Weight Bearing Restrictions Per Provider Order: No     Mobility Bed Mobility Overal bed mobility: Needs Assistance Bed Mobility: Supine to Sit, Sit to Supine     Supine to sit: Min assist Sit to supine:  Min assist   General bed mobility comments: Assist to manage RLE and trunk    Transfers Overall transfer level: Needs assistance Equipment used: Rolling walker (2 wheels) Transfers: Sit to/from Stand Sit to Stand: Mod assist           General transfer comment: Initial STS mod assist from bed. Progressed to CGA from toilet with use of GB. min assist for functional mobility with RW      Balance Overall balance assessment: Needs assistance Sitting-balance support: No upper extremity supported, Feet supported Sitting balance-Leahy Scale: Poor Sitting balance - Comments: CGA Postural control: Posterior lean Standing balance support: Bilateral upper extremity supported, During functional activity, Reliant on assistive device for balance Standing balance-Leahy Scale: Poor Standing balance comment: Reliant on RW and min assist     ADL either performed or assessed with clinical judgement   ADL Overall ADL's : Needs assistance/impaired Eating/Feeding: Set up;Sitting   Grooming: Minimal assistance;Standing;Wash/dry hands Grooming Details (indicate cue type and reason): min assist for balance         Upper Body Dressing : Moderate assistance;Sitting   Lower Body Dressing: Moderate assistance;Sit to/from stand   Toilet Transfer: Minimal assistance;Ambulation;Rolling walker (2 wheels);Regular Teacher, Adult Education Details (indicate cue type and reason): Reliant on GB for stand Toileting- Clothing Manipulation and Hygiene: Contact guard assist;Cueing for compensatory techniques;Sit to/from stand       Functional mobility during ADLs: Minimal assistance;Rolling walker (2 wheels) General ADL Comments: Limited d/t pain. Would benefit from further education on adaptive strategies and equipment     Vision Baseline Vision/History: 0 No visual deficits Vision Assessment?: No apparent visual deficits  Pertinent Vitals/Pain Pain Assessment Pain Assessment: 0-10 Pain  Score: 8  Pain Location: BUEs Pain Descriptors / Indicators: Burning, Tingling Pain Intervention(s): Premedicated before session, Patient requesting pain meds-RN notified     Extremity/Trunk Assessment Upper Extremity Assessment Upper Extremity Assessment: Right hand dominant;RUE deficits/detail;LUE deficits/detail RUE Deficits / Details: Burning sensationg R>L, poor grip strength, Wrist and elbow AROM WFL. Painful AROM with shoulder movements 2/5 RUE: Shoulder pain with ROM RUE Sensation: decreased light touch RUE Coordination: decreased fine motor;decreased gross motor LUE Deficits / Details: Burning sensationg R>L, poor grip strength, Wrist and elbow AROM WFL. Painful AROM with shoulder movements 2/5 LUE: Shoulder pain with ROM LUE Sensation: decreased light touch LUE Coordination: decreased fine motor;decreased gross motor   Lower Extremity Assessment Lower Extremity Assessment: Defer to PT evaluation   Cervical / Trunk Assessment Cervical / Trunk Assessment: Neck Surgery   Communication Communication Communication: No apparent difficulties   Cognition Arousal: Lethargic Behavior During Therapy: WFL for tasks assessed/performed Cognition: Cognition impaired       Memory impairment (select all impairments): Short-term memory Attention impairment (select first level of impairment): Sustained attention Executive functioning impairment (select all impairments): Problem solving OT - Cognition Comments: Pain distracting pt, potentially baseline stm and problem solving deficits                 Following commands: Intact       Cueing  General Comments   Cueing Techniques: Verbal cues  Pt c/o of increased burning sensation with use of BUEs           Home Living Family/patient expects to be discharged to:: Private residence Living Arrangements: Children;Other relatives Available Help at Discharge: Family;Available 24 hours/day Type of Home: House Home Access:  Level entry     Home Layout: Multi-level;Able to live on main level with bedroom/bathroom     Bathroom Shower/Tub: Walk-in shower;Tub only   Bathroom Toilet: Handicapped height     Home Equipment: Agricultural Consultant (2 wheels);Cane - single point;BSC/3in1;Shower seat;Wheelchair - manual          Prior Functioning/Environment Prior Level of Function : Needs assist;History of Falls (last six months)             Mobility Comments: Pt reports, primarily holding onto walls with mobility. Hx of falls, reports most recent fall 3 weeks prior to admisson resulted in concussion ADLs Comments: Son assists with LB ADLs    OT Problem List: Decreased strength;Decreased range of motion;Decreased activity tolerance;Impaired balance (sitting and/or standing);Decreased coordination;Decreased knowledge of use of DME or AE;Decreased knowledge of precautions;Impaired sensation;Impaired UE functional use   OT Treatment/Interventions: Self-care/ADL training;Therapeutic exercise;Energy conservation;DME and/or AE instruction;Neuromuscular education;Therapeutic activities;Visual/perceptual remediation/compensation;Patient/family education      OT Goals(Current goals can be found in the care plan section)   Acute Rehab OT Goals Patient Stated Goal: To get better OT Goal Formulation: With patient Time For Goal Achievement: 12/23/24 Potential to Achieve Goals: Good   OT Frequency:  Min 2X/week       AM-PAC OT 6 Clicks Daily Activity     Outcome Measure Help from another person eating meals?: A Little Help from another person taking care of personal grooming?: A Little Help from another person toileting, which includes using toliet, bedpan, or urinal?: A Little Help from another person bathing (including washing, rinsing, drying)?: A Lot Help from another person to put on and taking off regular upper body clothing?: A Lot Help from another person to put on and taking off regular lower  body  clothing?: A Lot 6 Click Score: 15   End of Session Equipment Utilized During Treatment: Gait belt;Rolling walker (2 wheels) Nurse Communication: Mobility status;Patient requests pain meds  Activity Tolerance: Patient limited by pain Patient left: in bed;with call bell/phone within reach;with family/visitor present  OT Visit Diagnosis: Unsteadiness on feet (R26.81);Other abnormalities of gait and mobility (R26.89);Repeated falls (R29.6);Muscle weakness (generalized) (M62.81);History of falling (Z91.81)                Time: 9255-9182 OT Time Calculation (min): 33 min Charges:  OT General Charges $OT Visit: 1 Visit OT Evaluation $OT Eval Moderate Complexity: 1 Mod OT Treatments $Self Care/Home Management : 8-22 mins  Adrianne BROCKS, OT  Acute Rehabilitation Services Office [phone] Secure chat preferred   Adrianne GORMAN Savers 12/09/2024, 8:52 AM

## 2024-12-09 NOTE — Care Management Obs Status (Signed)
 MEDICARE OBSERVATION STATUS NOTIFICATION   Patient Details  Name: Brandy Delacruz MRN: [id number] Date of Birth: 1961/04/06   Medicare Observation Status Notification Given:  Yes    Jennie Laneta Dragon 12/09/2024, 11:40 AM

## 2024-12-10 ENCOUNTER — Encounter (HOSPITAL_COMMUNITY): Payer: Self-pay

## 2024-12-10 DIAGNOSIS — Z6837 Body mass index (BMI) 37.0-37.9, adult: Secondary | ICD-10-CM | POA: Diagnosis not present

## 2024-12-10 DIAGNOSIS — Z79899 Other long term (current) drug therapy: Secondary | ICD-10-CM | POA: Diagnosis not present

## 2024-12-10 DIAGNOSIS — Z981 Arthrodesis status: Secondary | ICD-10-CM | POA: Diagnosis not present

## 2024-12-10 DIAGNOSIS — M5412 Radiculopathy, cervical region: Secondary | ICD-10-CM | POA: Diagnosis not present

## 2024-12-10 DIAGNOSIS — K219 Gastro-esophageal reflux disease without esophagitis: Secondary | ICD-10-CM | POA: Diagnosis present

## 2024-12-10 DIAGNOSIS — R531 Weakness: Secondary | ICD-10-CM | POA: Diagnosis present

## 2024-12-10 DIAGNOSIS — Z823 Family history of stroke: Secondary | ICD-10-CM | POA: Diagnosis not present

## 2024-12-10 DIAGNOSIS — E7800 Pure hypercholesterolemia, unspecified: Secondary | ICD-10-CM | POA: Diagnosis present

## 2024-12-10 DIAGNOSIS — R739 Hyperglycemia, unspecified: Secondary | ICD-10-CM | POA: Diagnosis not present

## 2024-12-10 DIAGNOSIS — Z833 Family history of diabetes mellitus: Secondary | ICD-10-CM | POA: Diagnosis not present

## 2024-12-10 DIAGNOSIS — I10 Essential (primary) hypertension: Secondary | ICD-10-CM | POA: Diagnosis present

## 2024-12-10 DIAGNOSIS — E66813 Obesity, class 3: Secondary | ICD-10-CM | POA: Diagnosis present

## 2024-12-10 DIAGNOSIS — Z886 Allergy status to analgesic agent status: Secondary | ICD-10-CM | POA: Diagnosis not present

## 2024-12-10 DIAGNOSIS — Z87891 Personal history of nicotine dependence: Secondary | ICD-10-CM | POA: Diagnosis not present

## 2024-12-10 DIAGNOSIS — T380X5A Adverse effect of glucocorticoids and synthetic analogues, initial encounter: Secondary | ICD-10-CM | POA: Diagnosis not present

## 2024-12-10 LAB — GLUCOSE, CAPILLARY
Glucose-Capillary: 137 mg/dL — ABNORMAL HIGH (ref 70–99)
Glucose-Capillary: 164 mg/dL — ABNORMAL HIGH (ref 70–99)
Glucose-Capillary: 140 mg/dL — ABNORMAL HIGH (ref 70–99)
Glucose-Capillary: 136 mg/dL — ABNORMAL HIGH (ref 70–99)

## 2024-12-10 LAB — BASIC METABOLIC PANEL WITH GFR
Anion gap: 11 (ref 5–15)
BUN: 26 mg/dL — ABNORMAL HIGH (ref 8–23)
CO2: 24 mmol/L (ref 22–32)
Calcium: 9.2 mg/dL (ref 8.9–10.3)
Chloride: 102 mmol/L (ref 98–111)
Creatinine, Ser: 1.31 mg/dL — ABNORMAL HIGH (ref 0.44–1.00)
GFR, Estimated: 46 mL/min — ABNORMAL LOW (ref >60)
Glucose, Bld: 228 mg/dL — ABNORMAL HIGH (ref 70–99)
Potassium: 4.6 mmol/L (ref 3.5–5.1)
Sodium: 137 mmol/L (ref 135–145)

## 2024-12-10 LAB — CBC
HCT: 33.9 % — ABNORMAL LOW (ref 36.0–46.0)
Hemoglobin: 10.9 g/dL — ABNORMAL LOW (ref 12.0–15.0)
MCH: 32.4 pg (ref 26.0–34.0)
MCHC: 32.2 g/dL (ref 30.0–36.0)
MCV: 100.9 fL — ABNORMAL HIGH (ref 80.0–100.0)
Platelets: 181 K/uL (ref 150–400)
RBC: 3.36 MIL/uL — ABNORMAL LOW (ref 3.87–5.11)
RDW: 12.8 % (ref 11.5–15.5)
WBC: 9.9 K/uL (ref 4.0–10.5)
nRBC: 0 % (ref 0.0–0.2)

## 2024-12-10 MED ORDER — DEXAMETHASONE SOD PHOSPHATE PF 10 MG/ML IJ SOLN
10.0000 mg | Freq: Three times a day (TID) | INTRAMUSCULAR | Status: AC
  Administered 2024-12-10 – 2024-12-11 (×4): 10 mg via INTRAVENOUS

## 2024-12-10 NOTE — Procedures (Signed)
°  Subjective: Procedures (LRB): ANTERIOR CERVICAL DECOMPRESSION/DISCECTOMY FUSION CERVICAL SIX-SEVEN, CERVICAL SEVEN- THORACIC ONE (N/A) 2 Days Post-Op  The patient reports a burning discomfort in her arms but is already resolved.  Hx reports some weakness in her right leg which is to be getting better for her but does still bother her as well.  Objective: Vital signs in last 24 hours: Temp:  [98.7 F (37.1 C)-99.5 F (37.5 C)] 98.7 F (37.1 C) (12/18 0333) Pulse Rate:  [64-73] 64 (12/18 0333) Resp:  [16-18] 18 (12/18 0333) BP: (105-119)/(63-69) 119/69 (12/18 0333) SpO2:  [93 %-96 %] 94 % (12/18 0333)  Intake/Output from previous day: 12/17 0701 - 12/18 0700 In: 1200 [P.O.:1200] Out: -   Labs: No results for input(s): WBC, RBC, HCT, PLT in the last 72 hours. No results for input(s): NA, K, CL, CO2, BUN, CREATININE, GLUCOSE, CALCIUM in the last 72 hours. No results for input(s): LABPT, INR in the last 72 hours.  Physical Exam:  Body mass index is 37.6 kg/m.  Incision C/D/I  Sensation     Right      Left  C5   Intact     Intact C6   Intact     Intact C7    hypersensitive    intact C8   hypersensitive    intact T1   hypersensitive    intact   Motor Exam     Right     Left Deltoids  5/5     5/5  Triceps   5/5     5/5  Biceps   5/5     5/5  Wrist Flexors  5/5     5/5  Wrist Extensors 5/5     5/5  Grip    5/5     5/5  Intrinsics  5/5     5/5   Right leg has 3-5 strength in the gastrocs 2 out of 5 strength in tib ant and EHL     Assessment/Plan: The patient continues to have a hypersensitive type pain.  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  Please keep covered on the pain.  Somewhat concerned about the weakness in her right foot.  Given the patient's pain she is somewhat difficult to examine however I would like to get an MRI scan just to make sure that missing something else we can explain her symptoms.  She does report some  improvement with steroids and will continue with these.  Will continue to work with physical therapy.  Continue with current pain regimen.  Continue current bowel regimen.  Cordella Rhein, MD, MS [facility] Orthopedics Specialist / [place] [phone]

## 2024-12-10 NOTE — Progress Notes (Signed)
 Physical Therapy Treatment  Patient Details Name: Brandy Delacruz MRN: [id number] DOB: [birth year] Today's Date: 12/10/2024   History of Present Illness Pt is a 63 y.o female admitted 12/16 for scheduled ACDF C6-T1. PMH: CKD, HTN    PT Comments  Pt progressing well with post-op mobility. She was able to demonstrate transfers and ambulation with gross CGA to supervision for safety and RW for support. Appears to have improved heel strike and active ankle DF this afternoon. Reinforced education on precautions, brace application/wearing schedule, appropriate activity progression, and car transfer. Will continue to follow.      If plan is discharge home, recommend the following: A little help with walking and/or transfers;A little help with bathing/dressing/bathroom;Assistance with cooking/housework;Assist for transportation;Help with stairs or ramp for entrance   Can travel by private vehicle        Equipment Recommendations  None recommended by PT    Recommendations for Other Services       Precautions / Restrictions Precautions Precautions: Fall;Cervical Precaution Booklet Issued: Yes (comment) Recall of Precautions/Restrictions: Impaired Precaution/Restrictions Comments: Reviewed handout and pt was cued for precautions during functional mobility. Required Braces or Orthoses: Cervical Brace Cervical Brace: Soft collar Restrictions Weight Bearing Restrictions Per Provider Order: No     Mobility  Bed Mobility Overal bed mobility: Needs Assistance Bed Mobility: Rolling, Sit to Sidelying, Sidelying to Sit Rolling: Supervision Sidelying to sit: Supervision     Sit to sidelying: Min assist General bed mobility comments: Assist for LE elevation back up into bed at end of session.    Transfers Overall transfer level: Needs assistance Equipment used: Rolling walker (2 wheels) Transfers: Sit to/from Stand Sit to Stand: Contact guard assist           General transfer  comment: CGA to stand from EOB and toilet; cues for hand placement.    Ambulation/Gait Ambulation/Gait assistance: Contact guard assist Gait Distance (Feet): 75 Feet Assistive device: Rolling walker (2 wheels) Gait Pattern/deviations: Step-through pattern, Decreased stride length, Trunk flexed Gait velocity: Decreased Gait velocity interpretation: <1.31 ft/sec, indicative of household ambulator   General Gait Details: VC's for improved posture, closer walker proximity and forward gaze. Pt moving slow and guarded. Improved heel strike with slippers on this session.   Stairs             Wheelchair Mobility     Tilt Bed    Modified Rankin (Stroke Patients Only)       Balance Overall balance assessment: Needs assistance Sitting-balance support: No upper extremity supported, Feet supported Sitting balance-Leahy Scale: Poor Sitting balance - Comments: CGA Postural control: Posterior lean Standing balance support: During functional activity, Reliant on assistive device for balance, No upper extremity supported Standing balance-Leahy Scale: Poor                              Communication Communication Communication: Impaired Factors Affecting Communication: Reduced clarity of speech  Cognition Arousal: Alert Behavior During Therapy: WFL for tasks assessed/performed   PT - Cognitive impairments: No apparent impairments                         Following commands: Intact      Cueing Cueing Techniques: Verbal cues  Exercises      General Comments        Pertinent Vitals/Pain Pain Assessment Pain Assessment: Faces Faces Pain Scale: Hurts little more Pain Location: RUE Pain Descriptors /  Indicators: Burning, Tingling, Grimacing, Discomfort Pain Intervention(s): Limited activity within patient's tolerance, Monitored during session, Repositioned    Home Living                          Prior Function            PT Goals  (current goals can now be found in the care plan section) Acute Rehab PT Goals Patient Stated Goal: Home at d/c PT Goal Formulation: With patient/family Time For Goal Achievement: 12/23/24 Potential to Achieve Goals: Good Progress towards PT goals: Progressing toward goals    Frequency    Min 5X/week      PT Plan      Co-evaluation              AM-PAC PT 6 Clicks Mobility   Outcome Measure  Help needed turning from your back to your side while in a flat bed without using bedrails?: A Little Help needed moving from lying on your back to sitting on the side of a flat bed without using bedrails?: A Little Help needed moving to and from a bed to a chair (including a wheelchair)?: A Little Help needed standing up from a chair using your arms (e.g., wheelchair or bedside chair)?: A Little Help needed to walk in hospital room?: A Little Help needed climbing 3-5 steps with a railing? : A Little 6 Click Score: 18    End of Session Equipment Utilized During Treatment: Gait belt;Cervical collar Activity Tolerance: Patient tolerated treatment well Patient left: in bed;with call bell/phone within reach;with family/visitor present Nurse Communication: Mobility status PT Visit Diagnosis: Unsteadiness on feet (R26.81);Pain Pain - Right/Left:  (Both, R worse than L) Pain - part of body: Arm;Hand;Ankle and joints of foot;Leg     Time: 1429-1510 PT Time Calculation (min) (ACUTE ONLY): 41 min  Charges:    $Gait Training: 38-52 mins PT General Charges $$ ACUTE PT VISIT: 1 Visit                     Leita Sable, PT, DPT Acute Rehabilitation Services Secure Chat Preferred Office: [phone]    Leita JONETTA Sable 12/10/2024, 3:20 PM

## 2024-12-10 NOTE — Progress Notes (Signed)
 Occupational Therapy Treatment Patient Details Name: Brandy Delacruz MRN: [id number] DOB: 1961-01-13 Today's Date: 12/10/2024   History of present illness Pt is a 63 y.o female admitted 12/16 for scheduled ACDF C6-T1. PMH: CKD, HTN   OT comments  Pt greeted EOB, pt agreeable to OT intervention. Pt continues to present with impaired sensation and weakness in RUE/RLE, increased pain, and decreased activity tolerance. Pt currently requires MODA for UB/LB ADLS, CGA for standing grooming tasks at sink and MODA for 3/3 toileting tasks. Pt completed ambulatory toilet transfer with RW and MODA d/t incontinent urine void when pt stood from EOB. Pt needed max cues for RW mgmt and problem solving mobility tasks as pt perseverating on deficits. Pt continued to endorse burning sensation in RUE>RLE with noted impairments in grip strength with pt unable to rip off toilet paper in bathroom. Continue to assess for changes to DC plan, but currently recommending HHOT with 24/7 supervision.       If plan is discharge home, recommend the following:  A lot of help with walking and/or transfers;A lot of help with bathing/dressing/bathroom;Assistance with cooking/housework;Assist for transportation   Equipment Recommendations  None recommended by OT    Recommendations for Other Services      Precautions / Restrictions Precautions Precautions: Fall;Cervical Precaution Booklet Issued: Yes (comment) Recall of Precautions/Restrictions: Impaired Restrictions Weight Bearing Restrictions Per Provider Order: No       Mobility Bed Mobility   Bed Mobility: Rolling, Sit to Sidelying Rolling: Supervision       Sit to sidelying: Mod assist General bed mobility comments: pt greeted EOB, pt needed MODA to transition froom EOB>sidelying needing assistance to manage BLEs back to bed, pt rolled to her back with supervision    Transfers Overall transfer level: Needs assistance Equipment used: Rolling walker  (2 wheels) Transfers: Sit to/from Stand Sit to Stand: Contact guard assist           General transfer comment: CGA to stand from EOB and toilet, cues for hand placement     Balance Overall balance assessment: Needs assistance Sitting-balance support: No upper extremity supported, Feet supported Sitting balance-Leahy Scale: Poor Sitting balance - Comments: CGA Postural control: Posterior lean Standing balance support: During functional activity, Reliant on assistive device for balance, No upper extremity supported Standing balance-Leahy Scale: Poor Standing balance comment: pt required at least unilateral support and CGA for standing grooming tasks                           ADL either performed or assessed with clinical judgement   ADL Overall ADL's : Needs assistance/impaired     Grooming: Wash/dry hands;Standing;Contact guard assist Grooming Details (indicate cue type and reason): CGA for balance with unilateral UB support     Lower Body Bathing: Moderate assistance;Sitting/lateral leans Lower Body Bathing Details (indicate cue type and reason): LB bathing from sitting on toilet d/t incontinent urine output Upper Body Dressing : Moderate assistance;Sitting Upper Body Dressing Details (indicate cue type and reason): to don new gown from toilet Lower Body Dressing: Moderate assistance;Sit to/from stand Lower Body Dressing Details (indicate cue type and reason): to don underwear from EOB Toilet Transfer: Ambulation;Rolling walker (2 wheels);Regular Toilet;Moderate assistance Toilet Transfer Details (indicate cue type and reason): MODA to ambulate from EOB>bathroom, increased assistance needed d/t pt perseverating on deficits needing max cues to attend to task Toileting- Clothing Manipulation and Hygiene: Contact guard assist;Cueing for back precautions Toileting - Clothing Manipulation Details (  indicate cue type and reason): lateral leans for pericare     Functional  mobility during ADLs: Minimal assistance;Rolling walker (2 wheels);Moderate assistance;Cueing for safety;Cueing for sequencing General ADL Comments: ADL participation limited by pain and impaired sensation in R side    Extremity/Trunk Assessment Upper Extremity Assessment Upper Extremity Assessment: Right hand dominant;RUE deficits/detail RUE Deficits / Details: burning sensation in RUE, attempting to use functionally but unable to grasp toilet paper to rip off of dispenser, wrist/elbow WFL, pain ROM in R shoulder RUE: Unable to fully assess due to pain RUE Sensation: decreased light touch RUE Coordination: decreased fine motor;decreased gross motor   Lower Extremity Assessment Lower Extremity Assessment: Defer to PT evaluation (pt reports feeling like cardboard was rubbing on her R foot when washing her R foot)   Cervical / Trunk Assessment Cervical / Trunk Assessment: Neck Surgery    Vision Baseline Vision/History: 0 No visual deficits Vision Assessment?: No apparent visual deficits   Perception Perception Perception: Within Functional Limits   Praxis Praxis Praxis: WFL   Communication Communication Communication: No apparent difficulties   Cognition Arousal: Alert Behavior During Therapy: WFL for tasks assessed/performed (moments of liability about current level of function, perseverating on trying to improve mobility) Cognition: Cognition impaired       Memory impairment (select all impairments): Short-term memory Attention impairment (select first level of impairment): Sustained attention Executive functioning impairment (select all impairments): Problem solving OT - Cognition Comments: pt perseverating on deficits wanting to know why this happened to her, needing max cues to orient to situation and max cues to problem solve mobility tasks                 Following commands: Intact        Cueing   Cueing Techniques: Verbal cues  Exercises      Shoulder  Instructions       General Comments      Pertinent Vitals/ Pain       Pain Assessment Pain Assessment: Faces Faces Pain Scale: Hurts little more Pain Location: RUE Pain Descriptors / Indicators: Burning, Tingling, Grimacing, Discomfort Pain Intervention(s): Limited activity within patient's tolerance, Monitored during session, Repositioned  Home Living                                          Prior Functioning/Environment              Frequency  Min 2X/week        Progress Toward Goals  OT Goals(current goals can now be found in the care plan section)  Progress towards OT goals: Progressing toward goals  Acute Rehab OT Goals Patient Stated Goal: to feel better OT Goal Formulation: With patient Time For Goal Achievement: 12/23/24 Potential to Achieve Goals: Good  Plan      Co-evaluation                 AM-PAC OT 6 Clicks Daily Activity     Outcome Measure   Help from another person eating meals?: A Little Help from another person taking care of personal grooming?: A Little Help from another person toileting, which includes using toliet, bedpan, or urinal?: A Little Help from another person bathing (including washing, rinsing, drying)?: A Lot Help from another person to put on and taking off regular upper body clothing?: A Little Help from another person to put on and taking  off regular lower body clothing?: A Lot 6 Click Score: 16    End of Session Equipment Utilized During Treatment: Gait belt;Rolling walker (2 wheels);Cervical collar;Other (comment) (soft collar)  OT Visit Diagnosis: Unsteadiness on feet (R26.81);Other abnormalities of gait and mobility (R26.89);Repeated falls (R29.6);Muscle weakness (generalized) (M62.81);History of falling (Z91.81)   Activity Tolerance Patient limited by pain;Other (comment) (incontinence episode)   Patient Left in bed;with call bell/phone within reach;with bed alarm set   Nurse  Communication Mobility status        Time: 9170-9092 OT Time Calculation (min): 38 min  Charges: OT General Charges $OT Visit: 1 Visit OT Treatments $Self Care/Home Management : 38-52 mins  Ronal Mallie POUR., COTA/L Acute Rehabilitation Services [phone]   Ronal Mallie Needy 12/10/2024, 9:19 AM

## 2024-12-10 NOTE — Progress Notes (Signed)
 Physical Therapy Treatment  Patient Details Name: Brandy Delacruz MRN: [id number] DOB: 1961-06-15 Today's Date: 12/10/2024   History of Present Illness Pt is a 63 y.o female admitted 12/16 for scheduled ACDF C6-T1. PMH: CKD, HTN    PT Comments  Noted pt with decreased heel strike and active DF during gait cycle today. Pt inverting foot and dragging forward to advance RLE during gait training. However, pt with increased ability to hold R ankle DF when passively moved into full ROM. Pt able to hold improved posture during OOB mobility this date. Noted pt going for MRI today. Focus of session was toileting, placing new briefs and pad prior to MRI, and gait training. Reinforced education, precautions, and safety. Will continue to follow.      If plan is discharge home, recommend the following: A little help with walking and/or transfers;A little help with bathing/dressing/bathroom;Assistance with cooking/housework;Assist for transportation;Help with stairs or ramp for entrance   Can travel by private vehicle        Equipment Recommendations  None recommended by PT    Recommendations for Other Services       Precautions / Restrictions Precautions Precautions: Fall;Cervical Precaution Booklet Issued: Yes (comment) Recall of Precautions/Restrictions: Impaired Required Braces or Orthoses: Cervical Brace Cervical Brace: Soft collar Restrictions Weight Bearing Restrictions Per Provider Order: No     Mobility  Bed Mobility Overal bed mobility: Needs Assistance Bed Mobility: Rolling, Sit to Sidelying, Sidelying to Sit Rolling: Supervision Sidelying to sit: Supervision     Sit to sidelying: Min assist General bed mobility comments: Assist for LE elevation back up into bed at end of session.    Transfers Overall transfer level: Needs assistance Equipment used: Rolling walker (2 wheels) Transfers: Sit to/from Stand Sit to Stand: Contact guard assist           General  transfer comment: CGA to stand from EOB and toilet; cues for hand placement.    Ambulation/Gait Ambulation/Gait assistance: Contact guard assist Gait Distance (Feet): 75 Feet Assistive device: Rolling walker (2 wheels) Gait Pattern/deviations: Step-through pattern, Decreased stride length, Trunk flexed Gait velocity: Decreased Gait velocity interpretation: <1.31 ft/sec, indicative of household ambulator   General Gait Details: VC's for improved posture, closer walker proximity and forward gaze. Pt moving slow and guarded. Noted decreased heel strike and active DF during gait, with pt inverting foot and dragging forward to advance.   Stairs             Wheelchair Mobility     Tilt Bed    Modified Rankin (Stroke Patients Only)       Balance Overall balance assessment: Needs assistance Sitting-balance support: No upper extremity supported, Feet supported Sitting balance-Leahy Scale: Poor Sitting balance - Comments: CGA Postural control: Posterior lean Standing balance support: During functional activity, Reliant on assistive device for balance, No upper extremity supported Standing balance-Leahy Scale: Poor                              Communication Communication Communication: Impaired Factors Affecting Communication: Reduced clarity of speech  Cognition Arousal: Alert Behavior During Therapy: WFL for tasks assessed/performed   PT - Cognitive impairments: No apparent impairments                         Following commands: Intact      Cueing Cueing Techniques: Verbal cues  Exercises      General Comments  Pertinent Vitals/Pain Pain Assessment Pain Assessment: Faces Faces Pain Scale: Hurts little more Pain Location: RUE Pain Descriptors / Indicators: Burning, Tingling, Grimacing, Discomfort Pain Intervention(s): Limited activity within patient's tolerance, Monitored during session, Repositioned    Home Living                           Prior Function            PT Goals (current goals can now be found in the care plan section) Acute Rehab PT Goals Patient Stated Goal: Home at d/c PT Goal Formulation: With patient/family Time For Goal Achievement: 12/23/24 Potential to Achieve Goals: Good Progress towards PT goals: Progressing toward goals    Frequency    Min 5X/week      PT Plan      Co-evaluation              AM-PAC PT 6 Clicks Mobility   Outcome Measure  Help needed turning from your back to your side while in a flat bed without using bedrails?: A Little Help needed moving from lying on your back to sitting on the side of a flat bed without using bedrails?: A Little Help needed moving to and from a bed to a chair (including a wheelchair)?: A Little Help needed standing up from a chair using your arms (e.g., wheelchair or bedside chair)?: A Little Help needed to walk in hospital room?: A Little Help needed climbing 3-5 steps with a railing? : A Little 6 Click Score: 18    End of Session Equipment Utilized During Treatment: Gait belt;Cervical collar Activity Tolerance: Patient tolerated treatment well Patient left: in bed;with call bell/phone within reach;with family/visitor present Nurse Communication: Mobility status PT Visit Diagnosis: Unsteadiness on feet (R26.81);Pain Pain - Right/Left:  (Both, R worse than L) Pain - part of body: Arm;Hand;Ankle and joints of foot;Leg     Time: 1000-1045 PT Time Calculation (min) (ACUTE ONLY): 45 min  Charges:    $Gait Training: 23-37 mins $Therapeutic Activity: 8-22 mins PT General Charges $$ ACUTE PT VISIT: 1 Visit                     Brandy Delacruz, PT, DPT Acute Rehabilitation Services Secure Chat Preferred Office: [phone]    Brandy JONETTA Delacruz 12/10/2024, 11:14 AM

## 2024-12-11 ENCOUNTER — Inpatient Hospital Stay (HOSPITAL_COMMUNITY)

## 2024-12-11 ENCOUNTER — Encounter (HOSPITAL_COMMUNITY): Payer: Self-pay

## 2024-12-11 DIAGNOSIS — Z981 Arthrodesis status: Secondary | ICD-10-CM

## 2024-12-11 DIAGNOSIS — M5412 Radiculopathy, cervical region: Secondary | ICD-10-CM | POA: Diagnosis not present

## 2024-12-11 LAB — GLUCOSE, CAPILLARY: Glucose-Capillary: 135 mg/dL — ABNORMAL HIGH (ref 70–99)

## 2024-12-11 MED ORDER — METHYLPREDNISOLONE 4 MG PO TBPK: ORAL_TABLET | ORAL | 0 refills | Status: AC

## 2024-12-11 MED ORDER — DEXAMETHASONE SOD PHOSPHATE PF 10 MG/ML IJ SOLN: 10.0000 mg | Freq: Two times a day (BID) | INTRAMUSCULAR | Status: DC

## 2024-12-11 NOTE — Progress Notes (Signed)
 Physical Therapy Treatment  Patient Details Name: Brandy Delacruz MRN: [id number] DOB: [birth year] Today's Date: 12/11/2024   History of Present Illness Pt is a 63 y.o female admitted 12/16 for scheduled ACDF C6-T1. PMH: CKD, HTN    PT Comments  Pt progressing well with post-op mobility. She was able to demonstrate transfers and ambulation with gross CGA to supervision for safety and RW for support. Overall improved RLE movement today and pt reports improved pain in UE's. Reinforced education on precautions, brace application/wearing schedule, appropriate activity progression, and car transfer. Will continue to follow.      If plan is discharge home, recommend the following: A little help with walking and/or transfers;A little help with bathing/dressing/bathroom;Assistance with cooking/housework;Assist for transportation;Help with stairs or ramp for entrance   Can travel by private vehicle        Equipment Recommendations  None recommended by PT    Recommendations for Other Services       Precautions / Restrictions Precautions Precautions: Fall;Cervical Precaution Booklet Issued: Yes (comment) Recall of Precautions/Restrictions: Intact Precaution/Restrictions Comments: Reviewed handout and pt was cued for precautions during functional mobility. Required Braces or Orthoses: Cervical Brace Cervical Brace: Soft collar Restrictions Weight Bearing Restrictions Per Provider Order: No     Mobility  Bed Mobility Overal bed mobility: Needs Assistance Bed Mobility: Sit to Sidelying, Rolling Rolling: Supervision Sidelying to sit: Supervision     Sit to sidelying: Min assist, Contact guard assist General bed mobility comments: Assist for LE elevation back up into bed at end of session.    Transfers Overall transfer level: Needs assistance Equipment used: Rolling walker (2 wheels) Transfers: Sit to/from Stand Sit to Stand: Supervision           General transfer  comment: Pt demonstrated good hand palcement on seated surface for safety.    Ambulation/Gait Ambulation/Gait assistance: Contact guard assist Gait Distance (Feet): 125 Feet Assistive device: Rolling walker (2 wheels) Gait Pattern/deviations: Step-through pattern, Decreased stride length, Trunk flexed Gait velocity: Decreased Gait velocity interpretation: <1.31 ft/sec, indicative of household ambulator   General Gait Details: VC's for sequencing with the RW. Pt starting out with a step-to pattern but was able to progress to step-through with cues.   Stairs             Wheelchair Mobility     Tilt Bed    Modified Rankin (Stroke Patients Only)       Balance Overall balance assessment: Needs assistance Sitting-balance support: No upper extremity supported, Feet supported Sitting balance-Leahy Scale: Fair Sitting balance - Comments: CGA Postural control: Posterior lean Standing balance support: During functional activity, Reliant on assistive device for balance, No upper extremity supported Standing balance-Leahy Scale: Poor Standing balance comment: reliant on RW                            Communication Communication Communication: Impaired Factors Affecting Communication: Reduced clarity of speech  Cognition Arousal: Alert Behavior During Therapy: WFL for tasks assessed/performed   PT - Cognitive impairments: No apparent impairments                         Following commands: Intact      Cueing Cueing Techniques: Verbal cues  Exercises      General Comments        Pertinent Vitals/Pain Pain Assessment Pain Assessment: Faces Faces Pain Scale: Hurts little more Pain Location: RUE Pain Descriptors /  Indicators: Burning, Tingling, Grimacing, Discomfort Pain Intervention(s): Limited activity within patient's tolerance, Monitored during session, Repositioned    Home Living                          Prior Function             PT Goals (current goals can now be found in the care plan section) Acute Rehab PT Goals Patient Stated Goal: Home at d/c PT Goal Formulation: With patient/family Time For Goal Achievement: 12/23/24 Potential to Achieve Goals: Good Progress towards PT goals: Progressing toward goals    Frequency    Min 5X/week      PT Plan      Co-evaluation              AM-PAC PT 6 Clicks Mobility   Outcome Measure  Help needed turning from your back to your side while in a flat bed without using bedrails?: A Little Help needed moving from lying on your back to sitting on the side of a flat bed without using bedrails?: A Little Help needed moving to and from a bed to a chair (including a wheelchair)?: A Little Help needed standing up from a chair using your arms (e.g., wheelchair or bedside chair)?: A Little Help needed to walk in hospital room?: A Little Help needed climbing 3-5 steps with a railing? : A Little 6 Click Score: 18    End of Session Equipment Utilized During Treatment: Gait belt;Cervical collar Activity Tolerance: Patient tolerated treatment well Patient left: in bed;with call bell/phone within reach;with family/visitor present Nurse Communication: Mobility status PT Visit Diagnosis: Unsteadiness on feet (R26.81);Pain Pain - Right/Left:  (Both, R worse than L) Pain - part of body: Arm;Hand;Ankle and joints of foot;Leg     Time: 8870-8846 PT Time Calculation (min) (ACUTE ONLY): 24 min  Charges:    $Gait Training: 23-37 mins PT General Charges $$ ACUTE PT VISIT: 1 Visit                     Leita Sable, PT, DPT Acute Rehabilitation Services Secure Chat Preferred Office: [phone]    Leita JONETTA Sable 12/11/2024, 1:05 PM

## 2024-12-11 NOTE — Progress Notes (Signed)
 Occupational Therapy Treatment Patient Details Name: Brandy Delacruz MRN: [id number] DOB: [birth year] Today's Date: 12/11/2024   History of present illness Pt is a 63 y.o female admitted 12/16 for scheduled ACDF C6-T1. PMH: CKD, HTN   OT comments  Pt progressing well towards goals. Progressed to complete toilet transfer with CGA. Noted pt with improved sequencing and problem solving with functional tasks. Educated pt on sensory re-education techniques to decrease hypersensitivity; as well as provided theraputty HEP to improve [facility]. Continue to recommend HHOT to optimize independence levels. Will continue to follow acutely.       If plan is discharge home, recommend the following:  A lot of help with walking and/or transfers;A lot of help with bathing/dressing/bathroom;Assistance with cooking/housework;Assist for transportation   Equipment Recommendations  None recommended by OT       Precautions / Restrictions Precautions Precautions: Fall;Cervical Precaution Booklet Issued: Yes (comment) Recall of Precautions/Restrictions: Intact Required Braces or Orthoses: Cervical Brace Cervical Brace: Soft collar Restrictions Weight Bearing Restrictions Per Provider Order: No       Mobility Bed Mobility Overal bed mobility: Needs Assistance Bed Mobility: Sit to Sidelying, Rolling Rolling: Supervision       Sit to sidelying: Min assist General bed mobility comments: Assist for LE elevation back up into bed at end of session.    Transfers Overall transfer level: Needs assistance Equipment used: Rolling walker (2 wheels) Transfers: Sit to/from Stand Sit to Stand: Contact guard assist           General transfer comment: CGA for balance, good hand placement     Balance Overall balance assessment: Needs assistance Sitting-balance support: No upper extremity supported, Feet supported Sitting balance-Leahy Scale: Fair     Standing balance support: During functional  activity, Reliant on assistive device for balance, No upper extremity supported Standing balance-Leahy Scale: Poor Standing balance comment: reliant on RW       ADL either performed or assessed with clinical judgement   ADL Overall ADL's : Needs assistance/impaired     Grooming: Wash/dry hands;Standing;Contact guard assist Grooming Details (indicate cue type and reason): CGA for balance     Toilet Transfer: Ambulation;Rolling walker (2 wheels);Regular Toilet;Contact guard assist Toilet Transfer Details (indicate cue type and reason): CGA for balance. Improved STS Toileting- Clothing Manipulation and Hygiene: Contact guard assist;Cueing for back precautions Toileting - Clothing Manipulation Details (indicate cue type and reason): lateral leans for pericare     Functional mobility during ADLs: Contact guard assist;Rolling walker (2 wheels) General ADL Comments: Hypersensitivity to RUE limiting pt    Extremity/Trunk Assessment Upper Extremity Assessment Upper Extremity Assessment: Right hand dominant;RUE deficits/detail RUE Deficits / Details: Pt still reporting burning sensation in RUE. Improved functional use, difficulty with grip and pinch RUE: Unable to fully assess due to pain RUE Sensation: decreased light touch RUE Coordination: decreased fine motor;decreased gross motor   Lower Extremity Assessment Lower Extremity Assessment: Defer to PT evaluation        Vision   Vision Assessment?: No apparent visual deficits         Communication Communication Communication: Impaired Factors Affecting Communication: Reduced clarity of speech   Cognition Arousal: Alert Behavior During Therapy: WFL for tasks assessed/performed Cognition: No apparent impairments             OT - Cognition Comments: Cognition improved from previous session. Pt able to verbalize precautions, and navigate obtacles in the hallway  Following commands: Intact         Cueing   Cueing Techniques: Verbal cues  Exercises Exercises: Other exercises Other Exercises Other Exercises: Pinch/pulls with extra soft thera-putty. x10 reps 3x day       General Comments Educated pt on use of sensory re-eduction strategies for RUE to reduce hypersensitivity    Pertinent Vitals/ Pain       Pain Assessment Pain Assessment: Faces Faces Pain Scale: Hurts even more Pain Location: RUE Pain Descriptors / Indicators: Burning, Tingling, Grimacing, Discomfort Pain Intervention(s): Monitored during session   Frequency  Min 2X/week        Progress Toward Goals  OT Goals(current goals can now be found in the care plan section)  Progress towards OT goals: Progressing toward goals  Acute Rehab OT Goals Patient Stated Goal: To feel better OT Goal Formulation: With patient Time For Goal Achievement: 12/23/24 Potential to Achieve Goals: Good ADL Goals Pt Will Perform Grooming: with supervision;standing Pt Will Perform Upper Body Dressing: with set-up;sitting Pt Will Perform Lower Body Dressing: with supervision;sit to/from stand;sitting/lateral leans Pt Will Transfer to Toilet: with supervision;ambulating;regular height toilet Pt Will Perform Toileting - Clothing Manipulation and hygiene: with supervision;sit to/from stand;sitting/lateral leans  Plan         AM-PAC OT 6 Clicks Daily Activity     Outcome Measure   Help from another person eating meals?: None Help from another person taking care of personal grooming?: A Little Help from another person toileting, which includes using toliet, bedpan, or urinal?: A Little Help from another person bathing (including washing, rinsing, drying)?: A Little Help from another person to put on and taking off regular upper body clothing?: A Little Help from another person to put on and taking off regular lower body clothing?: A Little 6 Click Score: 19    End of Session Equipment Utilized During Treatment: Gait  belt;Rolling walker (2 wheels);Cervical collar  OT Visit Diagnosis: Unsteadiness on feet (R26.81);Other abnormalities of gait and mobility (R26.89);Repeated falls (R29.6);Muscle weakness (generalized) (M62.81);History of falling (Z91.81)   Activity Tolerance Patient tolerated treatment well   Patient Left in bed;with call bell/phone within reach   Nurse Communication Mobility status        Time: 9257-9192 OT Time Calculation (min): 25 min  Charges: OT General Charges $OT Visit: 1 Visit OT Treatments $Self Care/Home Management : 8-22 mins $Therapeutic Activity: 8-22 mins  Adrianne BROCKS, OT  Acute Rehabilitation Services Office [phone] Secure chat preferred   Adrianne GORMAN Savers 12/11/2024, 8:45 AM

## 2024-12-11 NOTE — Discharge Summary (Signed)
 Physician Discharge Summary  Patient ID: Brandy Delacruz MRN: [id number] DOB/AGE: August 22, 1961 63 y.o.  Admit date: 12/08/2024 Discharge date: 12/11/2024  Admission Diagnoses:  Discharge Diagnoses:  Principal Problem:   Cervical radiculopathy   Discharged Condition: good  Hospital Course: Patient was admitted to the hospital on 12/08/24 and taken the operating room for an ACDF.  Please see the operative note.  She was transferred to the PACU and then the floor.  She was noted to have significant pain and hyperasthesia on the right arm the day after surgery.  She was also noted to have some right hand and foot weakness.  This was treated with steroid and therapy and the symptoms improved.  A MRI showed no evidence of nerve compression.  She was placed on a bowel regimen.  She was given TEDs and SCDs for DVT prophlaxis.  Her pain was controlled with oral and IV agents.  She was seen by physical and occupational therapy.    Consults: None  Significant Diagnostic Studies: MRI cervical spine showed no nerve impingerment  Treatments: surgery: 12/11/2024  Discharge Exam: Blood pressure (!) 98/58, pulse (!) 57, temperature 98.1 F (36.7 C), temperature source Oral, resp. rate 16, height 5' 1 (1.549 m), weight 90.3 kg, SpO2 94%. Well appearing woman, no acute distress Incision C/D/I 5/5 strength in the bilateral deltoids, biceps, triceps, wrist flexors, wrist extensors.  5/5 left grip & intrinsics 3-4/5 right grip & intrinsics  5/5 strength left leg  3/5 strength right Ta/EHL, 4/5 strength right gastroc  Hyersensitive to touch right hand  Disposition:   Discharge Instructions     Incentive spirometry RT   Complete by: As directed       Allergies as of 12/11/2024       Reactions   Ibuprofen Nausea And Vomiting, Nausea Only, Other (See Comments)   ibuprofen   Nsaids Other (See Comments)   Pt has GERD and patient has stage 3 kidney disease        Medication List      TAKE these medications    cetirizine 10 MG tablet Commonly known as: ZYRTEC Take 10 mg by mouth daily as needed for allergies.   clonazePAM  1 MG tablet Commonly known as: KLONOPIN  Take 1 mg by mouth 3 (three) times daily.   Dialyvite Vitamin D  5000 125 MCG (5000 UT) capsule Generic drug: Cholecalciferol  Take 5,000 Units by mouth at bedtime.   docusate sodium  100 MG capsule Commonly known as: COLACE Take 100 mg by mouth 2 (two) times daily.   escitalopram  20 MG tablet Commonly known as: LEXAPRO  Take 40 mg by mouth at bedtime.   famotidine  20 MG tablet Commonly known as: PEPCID  Take 20 mg by mouth 2 (two) times daily.   hydrocortisone  2.5 % cream Apply 1 Application topically daily.   losartan  50 MG tablet Commonly known as: COZAAR  Take 25 mg by mouth daily.   lovastatin 40 MG tablet Commonly known as: MEVACOR Take 40 mg by mouth at bedtime.   Lyrica  75 MG capsule Generic drug: pregabalin  Take 75 mg by mouth 3 (three) times daily.   Melatonin 10 MG Tabs Take 10-20 mg by mouth at bedtime.   methylPREDNISolone  4 MG Tbpk tablet Commonly known as: MEDROL  DOSEPAK Take as instructed   MS Contin  15 MG 12 hr tablet Generic drug: morphine  Take 15 mg by mouth every 8 (eight) hours.   omeprazole 40 MG capsule Commonly known as: PRILOSEC Take 40 mg by mouth daily.   tiZANidine  4  MG tablet Commonly known as: ZANAFLEX  Take 6 mg by mouth 3 (three) times daily as needed for muscle spasms.   torsemide  10 MG tablet Commonly known as: DEMADEX  Take 10 mg by mouth daily as needed (swelling).        Contact information for after-discharge care     Home Medical Care     [facility]) .   Service: Home Health Services Contact information: [address] [place]  [address] [phone]                     Signed: [name] 12/11/2024, 2:34 PM

## 2024-12-11 NOTE — Progress Notes (Addendum)
 " Subjective: Procedures (LRB): ANTERIOR CERVICAL DECOMPRESSION/DISCECTOMY FUSION CERVICAL SIX-SEVEN, CERVICAL SEVEN- THORACIC ONE (N/A) 3 Days Post-Op   Patient reports her pain is slowly improving as is her weakness.  She is walking multiple times a day.  She was made to the hall several times and feels she is getting better.  Still has burning sensation more across the right arm.  Minimal pain in the left arm.  Her right leg feels somewhat weak but does feel it improving the strength for  Objective: Vital signs in last 24 hours: Temp:  [98.3 F (36.8 C)-99 F (37.2 C)] 98.5 F (36.9 C) (12/19 0354) Pulse Rate:  [56-71] 58 (12/19 0354) Resp:  [16-20] 20 (12/19 0354) BP: (90-123)/(56-76) 123/71 (12/19 0354) SpO2:  [93 %-96 %] 95 % (12/19 0354)  Intake/Output from previous day: 12/18 0701 - 12/19 0700 In: 480 [P.O.:480] Out: -   Labs: Recent Labs    12/10/24 0856  WBC 9.9  RBC 3.36*  HCT 33.9*  PLT 181   Recent Labs    12/10/24 0856  NA 137  K 4.6  CL 102  CO2 24  BUN 26*  CREATININE 1.31*  GLUCOSE 228*  CALCIUM 9.2   No results for input(s): LABPT, INR in the last 72 hours.  Physical Exam:  Body mass index is 37.6 kg/m.  Incision C/D/I  Sensation     Right      Left  C5   Intact     Intact C6   hypersensitive    intact C7    hypersensitive    intact C8   hypersensitive    intact T1   Intact     Intact   Motor Exam     Right     Left Deltoids  5/5     5/5  Triceps   5/5     5/5  Biceps   5/5     5/5  Wrist Flexors  5/5     5/5  Wrist Extensors 5/5     5/5  Grip    4/5     5/5  Intrinsics  4/5     5/5  2-3/5 strength in the tibialis anterior and EHL on the right.  For pressure in the gastric     Assessment/Plan: The patient is making slow but steady progress.  Again she still has hypersensitivity in the right arm.  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  Is still  unclear whether to stick with surgical team or really orthopedics.  I had ordered an MRI scan yesterday.  She has not done.  I am going to reorder it today to make sure that it gets done.  We also spoke with the radiology as well as was not done.  Otherwise we will continue with her steroids.  These do seem to be helping and her pain and weakness is improving.  Will continue have her work with physical therapy.  Continue current pain and bowel regimen.  Will place a consult with case management to see if she may be a candidate for rehab versus SNF placement.  Lab results show elevated glucose which is consistent with her steroids.  Will continue with the sliding scale insulin  for this.  Creatinine is slightly elevated but consistent with baseline.  Otherwise labs appear to be within normal limits  Cordella Rhein, MD, MS [facility] [place] [phone]    MRI  shows no evidence of residual nerve compression, hematoma or infection "

## 2025-01-01 ENCOUNTER — Encounter (HOSPITAL_COMMUNITY): Payer: Self-pay

## 2025-02-24 ENCOUNTER — Other Ambulatory Visit
